# Patient Record
Sex: Female | Born: 1950 | Race: White | Hispanic: No | Marital: Married | Smoking: Never smoker
Health system: Southern US, Community
[De-identification: ages and names within clinical notes are randomized; demographics above are authoritative.]

## PROBLEM LIST (undated history)

## (undated) DIAGNOSIS — F419 Anxiety disorder, unspecified: Secondary | ICD-10-CM

## (undated) DIAGNOSIS — K589 Irritable bowel syndrome without diarrhea: Secondary | ICD-10-CM

## (undated) DIAGNOSIS — G4700 Insomnia, unspecified: Secondary | ICD-10-CM

## (undated) DIAGNOSIS — K219 Gastro-esophageal reflux disease without esophagitis: Secondary | ICD-10-CM

## (undated) DIAGNOSIS — C801 Malignant (primary) neoplasm, unspecified: Secondary | ICD-10-CM

## (undated) DIAGNOSIS — E079 Disorder of thyroid, unspecified: Secondary | ICD-10-CM

## (undated) DIAGNOSIS — E039 Hypothyroidism, unspecified: Secondary | ICD-10-CM

## (undated) HISTORY — PX: TUBAL LIGATION: SHX77

## (undated) HISTORY — PX: KNEE ARTHROSCOPY: SHX127

## (undated) HISTORY — DX: Gastro-esophageal reflux disease without esophagitis: K21.9

## (undated) HISTORY — PX: TONSILLECTOMY: SUR1361

## (undated) HISTORY — PX: APPENDECTOMY: SHX54

## (undated) HISTORY — PX: HERNIA REPAIR: SHX51

---

## 2015-07-18 DIAGNOSIS — J3081 Allergic rhinitis due to animal (cat) (dog) hair and dander: Secondary | ICD-10-CM | POA: Diagnosis not present

## 2015-07-18 DIAGNOSIS — J4520 Mild intermittent asthma, uncomplicated: Secondary | ICD-10-CM | POA: Diagnosis not present

## 2015-09-14 DIAGNOSIS — Z1283 Encounter for screening for malignant neoplasm of skin: Secondary | ICD-10-CM | POA: Diagnosis not present

## 2015-09-14 DIAGNOSIS — L820 Inflamed seborrheic keratosis: Secondary | ICD-10-CM | POA: Diagnosis not present

## 2015-09-14 DIAGNOSIS — Z808 Family history of malignant neoplasm of other organs or systems: Secondary | ICD-10-CM | POA: Diagnosis not present

## 2015-09-14 DIAGNOSIS — D229 Melanocytic nevi, unspecified: Secondary | ICD-10-CM | POA: Diagnosis not present

## 2015-09-26 DIAGNOSIS — E782 Mixed hyperlipidemia: Secondary | ICD-10-CM | POA: Diagnosis not present

## 2015-09-26 DIAGNOSIS — E034 Atrophy of thyroid (acquired): Secondary | ICD-10-CM | POA: Diagnosis not present

## 2015-10-03 DIAGNOSIS — F5102 Adjustment insomnia: Secondary | ICD-10-CM | POA: Diagnosis not present

## 2015-10-03 DIAGNOSIS — E034 Atrophy of thyroid (acquired): Secondary | ICD-10-CM | POA: Diagnosis not present

## 2015-10-03 DIAGNOSIS — K589 Irritable bowel syndrome without diarrhea: Secondary | ICD-10-CM | POA: Diagnosis not present

## 2015-10-03 DIAGNOSIS — E782 Mixed hyperlipidemia: Secondary | ICD-10-CM | POA: Diagnosis not present

## 2015-10-03 DIAGNOSIS — J4520 Mild intermittent asthma, uncomplicated: Secondary | ICD-10-CM | POA: Diagnosis not present

## 2015-11-29 DIAGNOSIS — H9192 Unspecified hearing loss, left ear: Secondary | ICD-10-CM | POA: Diagnosis not present

## 2015-11-29 DIAGNOSIS — R05 Cough: Secondary | ICD-10-CM | POA: Diagnosis not present

## 2015-11-29 DIAGNOSIS — G8911 Acute pain due to trauma: Secondary | ICD-10-CM | POA: Diagnosis not present

## 2015-11-29 DIAGNOSIS — H9202 Otalgia, left ear: Secondary | ICD-10-CM | POA: Diagnosis not present

## 2015-12-04 DIAGNOSIS — H9122 Sudden idiopathic hearing loss, left ear: Secondary | ICD-10-CM | POA: Diagnosis not present

## 2015-12-04 DIAGNOSIS — H9312 Tinnitus, left ear: Secondary | ICD-10-CM | POA: Diagnosis not present

## 2015-12-07 DIAGNOSIS — R233 Spontaneous ecchymoses: Secondary | ICD-10-CM | POA: Diagnosis not present

## 2015-12-07 DIAGNOSIS — R21 Rash and other nonspecific skin eruption: Secondary | ICD-10-CM | POA: Diagnosis not present

## 2015-12-07 DIAGNOSIS — K589 Irritable bowel syndrome without diarrhea: Secondary | ICD-10-CM | POA: Diagnosis not present

## 2015-12-11 DIAGNOSIS — H9122 Sudden idiopathic hearing loss, left ear: Secondary | ICD-10-CM | POA: Diagnosis not present

## 2015-12-22 DIAGNOSIS — K588 Other irritable bowel syndrome: Secondary | ICD-10-CM | POA: Diagnosis not present

## 2016-02-01 DIAGNOSIS — E039 Hypothyroidism, unspecified: Secondary | ICD-10-CM | POA: Diagnosis not present

## 2016-02-01 DIAGNOSIS — K589 Irritable bowel syndrome without diarrhea: Secondary | ICD-10-CM | POA: Diagnosis not present

## 2016-02-01 DIAGNOSIS — G4700 Insomnia, unspecified: Secondary | ICD-10-CM | POA: Diagnosis not present

## 2016-02-01 DIAGNOSIS — Z79899 Other long term (current) drug therapy: Secondary | ICD-10-CM | POA: Diagnosis not present

## 2016-02-29 DIAGNOSIS — Z1389 Encounter for screening for other disorder: Secondary | ICD-10-CM | POA: Diagnosis not present

## 2016-02-29 DIAGNOSIS — Z7189 Other specified counseling: Secondary | ICD-10-CM | POA: Diagnosis not present

## 2016-02-29 DIAGNOSIS — Z0000 Encounter for general adult medical examination without abnormal findings: Secondary | ICD-10-CM | POA: Diagnosis not present

## 2016-02-29 DIAGNOSIS — E039 Hypothyroidism, unspecified: Secondary | ICD-10-CM | POA: Diagnosis not present

## 2016-02-29 DIAGNOSIS — K589 Irritable bowel syndrome without diarrhea: Secondary | ICD-10-CM | POA: Diagnosis not present

## 2016-02-29 DIAGNOSIS — G4700 Insomnia, unspecified: Secondary | ICD-10-CM | POA: Diagnosis not present

## 2016-04-04 DIAGNOSIS — R109 Unspecified abdominal pain: Secondary | ICD-10-CM | POA: Diagnosis not present

## 2016-04-04 DIAGNOSIS — R110 Nausea: Secondary | ICD-10-CM | POA: Diagnosis not present

## 2016-04-15 DIAGNOSIS — N281 Cyst of kidney, acquired: Secondary | ICD-10-CM | POA: Diagnosis not present

## 2016-04-15 DIAGNOSIS — R110 Nausea: Secondary | ICD-10-CM | POA: Diagnosis not present

## 2016-04-15 DIAGNOSIS — K824 Cholesterolosis of gallbladder: Secondary | ICD-10-CM | POA: Diagnosis not present

## 2016-04-22 DIAGNOSIS — R1084 Generalized abdominal pain: Secondary | ICD-10-CM | POA: Diagnosis not present

## 2016-04-22 DIAGNOSIS — R198 Other specified symptoms and signs involving the digestive system and abdomen: Secondary | ICD-10-CM | POA: Diagnosis not present

## 2016-06-03 DIAGNOSIS — M25461 Effusion, right knee: Secondary | ICD-10-CM | POA: Diagnosis not present

## 2016-06-03 DIAGNOSIS — S86911A Strain of unspecified muscle(s) and tendon(s) at lower leg level, right leg, initial encounter: Secondary | ICD-10-CM | POA: Diagnosis not present

## 2016-06-03 DIAGNOSIS — R030 Elevated blood-pressure reading, without diagnosis of hypertension: Secondary | ICD-10-CM | POA: Diagnosis not present

## 2016-06-03 DIAGNOSIS — M25561 Pain in right knee: Secondary | ICD-10-CM | POA: Diagnosis not present

## 2016-06-03 DIAGNOSIS — S86811A Strain of other muscle(s) and tendon(s) at lower leg level, right leg, initial encounter: Secondary | ICD-10-CM | POA: Diagnosis not present

## 2016-06-03 DIAGNOSIS — M25569 Pain in unspecified knee: Secondary | ICD-10-CM | POA: Diagnosis not present

## 2016-06-04 DIAGNOSIS — M7121 Synovial cyst of popliteal space [Baker], right knee: Secondary | ICD-10-CM | POA: Diagnosis not present

## 2016-06-04 DIAGNOSIS — M25561 Pain in right knee: Secondary | ICD-10-CM | POA: Diagnosis not present

## 2016-06-13 DIAGNOSIS — G4700 Insomnia, unspecified: Secondary | ICD-10-CM | POA: Diagnosis not present

## 2016-06-13 DIAGNOSIS — E039 Hypothyroidism, unspecified: Secondary | ICD-10-CM | POA: Diagnosis not present

## 2016-06-13 DIAGNOSIS — K589 Irritable bowel syndrome without diarrhea: Secondary | ICD-10-CM | POA: Diagnosis not present

## 2016-06-13 DIAGNOSIS — Z79899 Other long term (current) drug therapy: Secondary | ICD-10-CM | POA: Diagnosis not present

## 2016-06-13 DIAGNOSIS — Z6827 Body mass index (BMI) 27.0-27.9, adult: Secondary | ICD-10-CM | POA: Diagnosis not present

## 2016-07-02 DIAGNOSIS — M25561 Pain in right knee: Secondary | ICD-10-CM | POA: Diagnosis not present

## 2016-07-02 DIAGNOSIS — M7121 Synovial cyst of popliteal space [Baker], right knee: Secondary | ICD-10-CM | POA: Diagnosis not present

## 2016-07-03 DIAGNOSIS — K589 Irritable bowel syndrome without diarrhea: Secondary | ICD-10-CM | POA: Diagnosis not present

## 2016-07-03 DIAGNOSIS — K5732 Diverticulitis of large intestine without perforation or abscess without bleeding: Secondary | ICD-10-CM | POA: Diagnosis not present

## 2016-07-03 DIAGNOSIS — K5792 Diverticulitis of intestine, part unspecified, without perforation or abscess without bleeding: Secondary | ICD-10-CM | POA: Diagnosis not present

## 2016-07-03 DIAGNOSIS — R1032 Left lower quadrant pain: Secondary | ICD-10-CM | POA: Diagnosis not present

## 2016-07-03 DIAGNOSIS — N390 Urinary tract infection, site not specified: Secondary | ICD-10-CM | POA: Diagnosis not present

## 2016-07-05 DIAGNOSIS — R3989 Other symptoms and signs involving the genitourinary system: Secondary | ICD-10-CM | POA: Diagnosis not present

## 2016-07-05 DIAGNOSIS — R319 Hematuria, unspecified: Secondary | ICD-10-CM | POA: Diagnosis not present

## 2016-07-05 DIAGNOSIS — R1032 Left lower quadrant pain: Secondary | ICD-10-CM | POA: Diagnosis not present

## 2016-07-05 DIAGNOSIS — T368X5A Adverse effect of other systemic antibiotics, initial encounter: Secondary | ICD-10-CM | POA: Diagnosis not present

## 2016-07-05 DIAGNOSIS — R8299 Other abnormal findings in urine: Secondary | ICD-10-CM | POA: Diagnosis not present

## 2016-07-05 DIAGNOSIS — Z8719 Personal history of other diseases of the digestive system: Secondary | ICD-10-CM | POA: Diagnosis not present

## 2016-07-05 DIAGNOSIS — R110 Nausea: Secondary | ICD-10-CM | POA: Diagnosis not present

## 2016-07-05 DIAGNOSIS — T373X5A Adverse effect of other antiprotozoal drugs, initial encounter: Secondary | ICD-10-CM | POA: Diagnosis not present

## 2016-08-06 DIAGNOSIS — M25561 Pain in right knee: Secondary | ICD-10-CM | POA: Diagnosis not present

## 2016-08-20 DIAGNOSIS — M23021 Cystic meniscus, posterior horn of medial meniscus, right knee: Secondary | ICD-10-CM | POA: Diagnosis not present

## 2016-08-20 DIAGNOSIS — M1711 Unilateral primary osteoarthritis, right knee: Secondary | ICD-10-CM | POA: Diagnosis not present

## 2016-08-20 DIAGNOSIS — M25561 Pain in right knee: Secondary | ICD-10-CM | POA: Diagnosis not present

## 2016-08-22 DIAGNOSIS — M25561 Pain in right knee: Secondary | ICD-10-CM | POA: Diagnosis not present

## 2016-08-28 DIAGNOSIS — S83281A Other tear of lateral meniscus, current injury, right knee, initial encounter: Secondary | ICD-10-CM | POA: Diagnosis not present

## 2016-08-28 DIAGNOSIS — M23251 Derangement of posterior horn of lateral meniscus due to old tear or injury, right knee: Secondary | ICD-10-CM | POA: Diagnosis not present

## 2016-08-28 DIAGNOSIS — S83241A Other tear of medial meniscus, current injury, right knee, initial encounter: Secondary | ICD-10-CM | POA: Diagnosis not present

## 2016-08-28 DIAGNOSIS — M23211 Derangement of anterior horn of medial meniscus due to old tear or injury, right knee: Secondary | ICD-10-CM | POA: Diagnosis not present

## 2016-09-12 DIAGNOSIS — G4700 Insomnia, unspecified: Secondary | ICD-10-CM | POA: Diagnosis not present

## 2016-09-12 DIAGNOSIS — Z6827 Body mass index (BMI) 27.0-27.9, adult: Secondary | ICD-10-CM | POA: Diagnosis not present

## 2016-09-12 DIAGNOSIS — F419 Anxiety disorder, unspecified: Secondary | ICD-10-CM | POA: Diagnosis not present

## 2016-10-10 DIAGNOSIS — Z1231 Encounter for screening mammogram for malignant neoplasm of breast: Secondary | ICD-10-CM | POA: Diagnosis not present

## 2016-10-10 DIAGNOSIS — N6312 Unspecified lump in the right breast, upper inner quadrant: Secondary | ICD-10-CM | POA: Diagnosis not present

## 2016-10-10 DIAGNOSIS — F419 Anxiety disorder, unspecified: Secondary | ICD-10-CM | POA: Diagnosis not present

## 2016-10-10 DIAGNOSIS — Z803 Family history of malignant neoplasm of breast: Secondary | ICD-10-CM | POA: Diagnosis not present

## 2016-10-10 DIAGNOSIS — R921 Mammographic calcification found on diagnostic imaging of breast: Secondary | ICD-10-CM | POA: Diagnosis not present

## 2016-10-10 DIAGNOSIS — N6341 Unspecified lump in right breast, subareolar: Secondary | ICD-10-CM | POA: Diagnosis not present

## 2016-10-14 DIAGNOSIS — K589 Irritable bowel syndrome without diarrhea: Secondary | ICD-10-CM | POA: Diagnosis not present

## 2016-10-18 DIAGNOSIS — N6312 Unspecified lump in the right breast, upper inner quadrant: Secondary | ICD-10-CM | POA: Diagnosis not present

## 2016-10-18 DIAGNOSIS — Z803 Family history of malignant neoplasm of breast: Secondary | ICD-10-CM | POA: Diagnosis not present

## 2016-10-22 DIAGNOSIS — N6312 Unspecified lump in the right breast, upper inner quadrant: Secondary | ICD-10-CM | POA: Diagnosis not present

## 2016-10-22 DIAGNOSIS — N6310 Unspecified lump in the right breast, unspecified quadrant: Secondary | ICD-10-CM | POA: Diagnosis not present

## 2016-10-22 DIAGNOSIS — D241 Benign neoplasm of right breast: Secondary | ICD-10-CM | POA: Diagnosis not present

## 2016-12-11 DIAGNOSIS — Z6826 Body mass index (BMI) 26.0-26.9, adult: Secondary | ICD-10-CM | POA: Diagnosis not present

## 2016-12-11 DIAGNOSIS — G4700 Insomnia, unspecified: Secondary | ICD-10-CM | POA: Diagnosis not present

## 2016-12-11 DIAGNOSIS — Z0000 Encounter for general adult medical examination without abnormal findings: Secondary | ICD-10-CM | POA: Diagnosis not present

## 2016-12-11 DIAGNOSIS — K589 Irritable bowel syndrome without diarrhea: Secondary | ICD-10-CM | POA: Diagnosis not present

## 2016-12-11 DIAGNOSIS — F419 Anxiety disorder, unspecified: Secondary | ICD-10-CM | POA: Diagnosis not present

## 2016-12-11 DIAGNOSIS — R9431 Abnormal electrocardiogram [ECG] [EKG]: Secondary | ICD-10-CM | POA: Diagnosis not present

## 2017-03-06 ENCOUNTER — Encounter: Payer: Self-pay | Admitting: Emergency Medicine

## 2017-03-06 ENCOUNTER — Ambulatory Visit
Admission: EM | Admit: 2017-03-06 | Discharge: 2017-03-06 | Disposition: A | Discharge status: Home / Self Care | Payer: Medicare Other | Attending: Family Medicine | Admitting: Family Medicine

## 2017-03-06 DIAGNOSIS — R1012 Left upper quadrant pain: Secondary | ICD-10-CM | POA: Insufficient documentation

## 2017-03-06 DIAGNOSIS — G8929 Other chronic pain: Secondary | ICD-10-CM

## 2017-03-06 DIAGNOSIS — R109 Unspecified abdominal pain: Secondary | ICD-10-CM | POA: Diagnosis present

## 2017-03-06 DIAGNOSIS — R1013 Epigastric pain: Secondary | ICD-10-CM | POA: Diagnosis not present

## 2017-03-06 HISTORY — DX: Insomnia, unspecified: G47.00

## 2017-03-06 HISTORY — DX: Hypothyroidism, unspecified: E03.9

## 2017-03-06 HISTORY — DX: Irritable bowel syndrome without diarrhea: K58.9

## 2017-03-06 HISTORY — DX: Anxiety disorder, unspecified: F41.9

## 2017-03-06 LAB — COMPREHENSIVE METABOLIC PANEL
ALBUMIN: 4.3 g/dL (ref 3.5–5.0)
ALK PHOS: 74 U/L (ref 38–126)
ALT: 17 U/L (ref 14–54)
AST: 25 U/L (ref 15–41)
Anion gap: 8 (ref 5–15)
BILIRUBIN TOTAL: 0.6 mg/dL (ref 0.3–1.2)
BUN: 17 mg/dL (ref 6–20)
CALCIUM: 9.5 mg/dL (ref 8.9–10.3)
CO2: 30 mmol/L (ref 22–32)
CREATININE: 0.98 mg/dL (ref 0.44–1.00)
Chloride: 102 mmol/L (ref 101–111)
GFR calc Af Amer: >60 mL/min (ref >60)
GFR calc non Af Amer: 58 mL/min — ABNORMAL LOW (ref >60)
GLUCOSE: 143 mg/dL — AB (ref 65–99)
Potassium: 3.7 mmol/L (ref 3.5–5.1)
SODIUM: 140 mmol/L (ref 135–145)
TOTAL PROTEIN: 7.4 g/dL (ref 6.5–8.1)

## 2017-03-06 LAB — CBC WITH DIFFERENTIAL/PLATELET
BASOS ABS: 0 10*3/uL (ref 0–0.1)
BASOS PCT: 1 %
EOS PCT: 7 %
Eosinophils Absolute: 0.2 10*3/uL (ref 0–0.7)
HEMATOCRIT: 41.4 % (ref 35.0–47.0)
Hemoglobin: 13.9 g/dL (ref 12.0–16.0)
Lymphocytes Relative: 29 %
Lymphs Abs: 1.1 10*3/uL (ref 1.0–3.6)
MCH: 30.8 pg (ref 26.0–34.0)
MCHC: 33.5 g/dL (ref 32.0–36.0)
MCV: 92 fL (ref 80.0–100.0)
MONO ABS: 0.2 10*3/uL (ref 0.2–0.9)
MONOS PCT: 6 %
Neutro Abs: 2.1 10*3/uL (ref 1.4–6.5)
Neutrophils Relative %: 57 %
PLATELETS: 218 10*3/uL (ref 150–440)
RBC: 4.5 MIL/uL (ref 3.80–5.20)
RDW: 13.3 % (ref 11.5–14.5)
WBC: 3.7 10*3/uL (ref 3.6–11.0)

## 2017-03-06 LAB — URINALYSIS, COMPLETE (UACMP) WITH MICROSCOPIC
BACTERIA UA: NONE SEEN
BILIRUBIN URINE: NEGATIVE
Glucose, UA: NEGATIVE mg/dL
Ketones, ur: NEGATIVE mg/dL
LEUKOCYTES UA: NEGATIVE
Nitrite: NEGATIVE
PH: 7 (ref 5.0–8.0)
Protein, ur: NEGATIVE mg/dL
SPECIFIC GRAVITY, URINE: 1.02 (ref 1.005–1.030)
WBC, UA: NONE SEEN WBC/hpf (ref 0–5)

## 2017-03-06 LAB — LIPASE, BLOOD: Lipase: 41 U/L (ref 11–51)

## 2017-03-06 MED ORDER — PANTOPRAZOLE SODIUM 40 MG PO TBEC: 40.0000 mg | DELAYED_RELEASE_TABLET | Freq: Every day | ORAL | 1 refills | Status: DC

## 2017-03-06 NOTE — Discharge Instructions (Signed)
Labs normal except for elevated glucose. Please have your primary check A1c.  Take the Protonix daily.  Follow up closely with your primary.  Take care  Dr. Lacinda Axon

## 2017-03-06 NOTE — ED Provider Notes (Signed)
[facility]    CSN: [id number] Arrival date & time: 03/06/17  [place]  History   Chief Complaint Chief Complaint  Patient presents with  . Abdominal Pain   HPI  66 year old female with IBS presents with ongoing abdominal pain.  Patient reports that 8 month history of intermittent abdominal pain. Located in the epigastric and left upper quadrant. Described as a pressure/burning sensation. She also reports gas and bloating. Worse after she eats and tends to occur fairly quickly after she eats. No known relieving factors. No associated fever or chills. Sometimes has nausea. No vomiting.. No reports of weight loss. She states that she has a history of diverticulitis but states that this is different. She has seen gastroenterology in [place] but no cause is been identified. She is currently in 5/10 pain. Her pain started this evening after eating.  Past Medical History:  Diagnosis Date  . Anxiety   . Hypothyroidism   . IBS (irritable bowel syndrome)   . Insomnia    Past Surgical History:  Procedure Laterality Date  . APPENDECTOMY    . KNEE ARTHROSCOPY    . TUBAL LIGATION     Home Medications    Prior to Admission medications   Medication Sig Start Date End Date Taking? Authorizing Provider  amitriptyline (ELAVIL) 25 MG tablet Take 25 mg by mouth at bedtime.   Yes [provider]  levothyroxine (SYNTHROID, LEVOTHROID) 50 MCG tablet Take 50 mcg by mouth daily before breakfast.   Yes [provider]  traZODone (DESYREL) 50 MG tablet Take 50 mg by mouth 2 (two) times daily.   Yes [provider]  pantoprazole (PROTONIX) 40 MG tablet Take 1 tablet (40 mg total) by mouth daily. 03/06/17   Coral Spikes, DO   Family History Family History  Problem Relation Age of Onset  . Cancer Mother   . Cancer Sister    Social History Social History  Substance Use Topics  . Smoking status: Never Smoker  . Smokeless tobacco: Never Used  . Alcohol use Yes      Allergies   Codeine   Review of Systems Review of Systems  Constitutional: Negative for chills and fever.  Gastrointestinal: Positive for abdominal pain, diarrhea and nausea. Negative for vomiting.  All other systems reviewed and are negative.  Physical Exam Triage Vital Signs ED Triage Vitals  Enc Vitals Group     BP 03/06/17 1916 133/84     Pulse Rate 03/06/17 1916 82     Resp 03/06/17 1916 16     Temp 03/06/17 1916 98.2 F (36.8 C)     Temp Source 03/06/17 1916 Oral     SpO2 03/06/17 1916 100 %     Weight 03/06/17 1917 134 lb (60.8 kg)     Height 03/06/17 1917 5\' 2"  (1.575 m)     Head Circumference --      Peak Flow --      Pain Score 03/06/17 1917 7     Pain Loc --      Pain Edu? --      Excl. in [place]? --    Updated Vital Signs BP 133/84 (BP Location: Left Arm)   Pulse 82   Temp 98.2 F (36.8 C) (Oral)   Resp 16   Ht 5\' 2"  (1.575 m)   Wt 134 lb (60.8 kg)   SpO2 100%   BMI 24.51 kg/m      Physical Exam  Constitutional: She is oriented to person, place,  and time. She appears well-developed. No distress.  HENT:  Head: Normocephalic and atraumatic.  Nose: Nose normal.  Mouth/Throat: Oropharynx is clear and moist.  Eyes: Conjunctivae are normal. No scleral icterus.  Neck: Normal range of motion. No tracheal deviation present. No thyromegaly present.  Cardiovascular: Normal rate, regular rhythm and normal heart sounds.   Pulmonary/Chest: Effort normal and breath sounds normal. No respiratory distress. She has no wheezes. She has no rales.  Abdominal: Soft. She exhibits no distension.  Tender to palpation in the epigastric region and left upper quadrant. No rebound or guarding.  Musculoskeletal: Normal range of motion.  No CVA tenderness.  Neurological: She is alert and oriented to person, place, and time.  Normal tone. No apparent deficits.  Skin: Skin is warm. No rash noted.  Psychiatric:  Flat affect. Depressed mood.  Vitals reviewed.  UC Treatments  / Results  Labs (all labs ordered are listed, but only abnormal results are displayed) Labs Reviewed  URINALYSIS, COMPLETE (UACMP) WITH MICROSCOPIC - Abnormal; Notable for the following:       Result Value   Hgb urine dipstick TRACE (*)    Squamous Epithelial / LPF 0-5 (*)    All other components within normal limits  COMPREHENSIVE METABOLIC PANEL - Abnormal; Notable for the following:    Glucose, Bld 143 (*)    GFR calc non Af Amer 58 (*)    All other components within normal limits  CBC WITH DIFFERENTIAL/PLATELET  LIPASE, BLOOD    EKG  EKG Interpretation None      Radiology No results found.  Procedures Procedures (including critical care time)  Medications Ordered in UC Medications - No data to display   Initial Impression / Assessment and Plan / UC Course  I have reviewed the triage vital signs and the nursing notes.  Pertinent labs & imaging results that were available during my care of the patient were reviewed by me and considered in my medical decision making (see chart for details).   66 year old female presents with chronic epigastric/left upper quadrant pain. Well-appearing. No acute distress. Labs revealed elevated glucose but otherwise unremarkable. Does not appear to have UTI. No reports of urinary symptoms. Patient likely has gastritis/peptic ulcer disease. Treating with Protonix. Advised her to follow-up with her new primary. If fails to improve we'll need referral to GI and endoscopy.  Final Clinical Impressions(s) / UC Diagnoses   Final diagnoses:  Abdominal pain, chronic, epigastric  LUQ pain   New Prescriptions Discharge Medication List as of 03/06/2017  8:05 PM    START taking these medications   Details  pantoprazole (PROTONIX) 40 MG tablet Take 1 tablet (40 mg total) by mouth daily., Starting Thu 03/06/2017, Normal       Controlled Substance Prescriptions [place] Controlled Substance Registry consulted? Not Applicable   Coral Spikes,  DO 03/06/17 2016

## 2017-03-06 NOTE — ED Triage Notes (Signed)
Patient reports having upper abdominal pain after she eats.  Patient states that this symptom has been going on off and on for months.  Patient thinks she might have Diverticulosis. Patient reports pain started around 5pm today.

## 2017-03-07 ENCOUNTER — Telehealth: Payer: Self-pay | Admitting: Gastroenterology

## 2017-03-07 NOTE — Telephone Encounter (Signed)
Attempted to contact patient to schedule ED follow up for abdominal pain, epigastric pain. No voicemail.

## 2017-03-10 ENCOUNTER — Other Ambulatory Visit: Payer: Self-pay

## 2017-03-10 ENCOUNTER — Ambulatory Visit: Payer: Medicare Other | Admitting: Gastroenterology

## 2017-03-20 DIAGNOSIS — I493 Ventricular premature depolarization: Secondary | ICD-10-CM | POA: Insufficient documentation

## 2017-03-20 DIAGNOSIS — K219 Gastro-esophageal reflux disease without esophagitis: Secondary | ICD-10-CM | POA: Insufficient documentation

## 2017-03-20 DIAGNOSIS — E782 Mixed hyperlipidemia: Secondary | ICD-10-CM | POA: Insufficient documentation

## 2017-03-24 DIAGNOSIS — G4709 Other insomnia: Secondary | ICD-10-CM | POA: Diagnosis not present

## 2017-03-24 DIAGNOSIS — R29890 Loss of height: Secondary | ICD-10-CM | POA: Diagnosis not present

## 2017-03-24 DIAGNOSIS — K580 Irritable bowel syndrome with diarrhea: Secondary | ICD-10-CM | POA: Diagnosis not present

## 2017-03-24 DIAGNOSIS — M899 Disorder of bone, unspecified: Secondary | ICD-10-CM | POA: Diagnosis not present

## 2017-03-24 DIAGNOSIS — K219 Gastro-esophageal reflux disease without esophagitis: Secondary | ICD-10-CM | POA: Diagnosis not present

## 2017-03-24 DIAGNOSIS — F411 Generalized anxiety disorder: Secondary | ICD-10-CM | POA: Diagnosis not present

## 2017-03-24 DIAGNOSIS — E039 Hypothyroidism, unspecified: Secondary | ICD-10-CM | POA: Diagnosis not present

## 2017-03-24 DIAGNOSIS — Z23 Encounter for immunization: Secondary | ICD-10-CM | POA: Diagnosis not present

## 2017-03-27 ENCOUNTER — Other Ambulatory Visit: Payer: Self-pay | Admitting: Pediatrics

## 2017-03-27 DIAGNOSIS — R29890 Loss of height: Secondary | ICD-10-CM

## 2017-03-27 DIAGNOSIS — K580 Irritable bowel syndrome with diarrhea: Secondary | ICD-10-CM

## 2017-03-27 DIAGNOSIS — E039 Hypothyroidism, unspecified: Secondary | ICD-10-CM

## 2017-03-27 DIAGNOSIS — I493 Ventricular premature depolarization: Secondary | ICD-10-CM | POA: Diagnosis not present

## 2017-03-27 DIAGNOSIS — M899 Disorder of bone, unspecified: Secondary | ICD-10-CM

## 2017-04-03 ENCOUNTER — Ambulatory Visit
Admission: EM | Admit: 2017-04-03 | Discharge: 2017-04-03 | Disposition: A | Discharge status: Home / Self Care | Payer: Medicare Other | Attending: Family Medicine | Admitting: Family Medicine

## 2017-04-03 ENCOUNTER — Encounter: Payer: Self-pay | Admitting: *Deleted

## 2017-04-03 DIAGNOSIS — R1013 Epigastric pain: Secondary | ICD-10-CM

## 2017-04-03 DIAGNOSIS — G8929 Other chronic pain: Secondary | ICD-10-CM

## 2017-04-03 MED ORDER — PANTOPRAZOLE SODIUM 40 MG PO TBEC: 40.0000 mg | DELAYED_RELEASE_TABLET | Freq: Two times a day (BID) | ORAL | 1 refills | Status: DC

## 2017-04-03 NOTE — Discharge Instructions (Signed)
Increase the protonix to 2 times daily before a meal.  Be sure to see Dr. Allen Norris.  Take care  Dr. Lacinda Axon

## 2017-04-03 NOTE — ED Triage Notes (Signed)
C/O of abd pain. No other symptoms. Pt states she was seen her about a month ago

## 2017-04-03 NOTE — ED Provider Notes (Signed)
[facility]    CSN: [id number] Arrival date & time: 04/03/17  1756     History   Chief Complaint Chief Complaint  Patient presents with  . Abdominal Pain   HPI  66 year old female presents with abdominal pain.  I recently saw her for this on 10/11.  She has had chronic epigastric/left upper quadrant pain for months.  I put her on Protonix and she states that she has been doing well.  Yesterday however, she had worsening in her pain.  She is unsure of the cause.  She states no different foods or known inciting factors.  She states that she has taken half of a Protonix today and has felt better today.  Pain is currently 6 out of 10 in severity.  She typically takes her Protonix at night.  I told her previously that she needed an endoscopy and her primary seems to support this.  She has an upcoming appointment with GI.  No other reported symptoms.  She states that everything is the same.  She still has gas and bloating and burning pain in the epigastric/left upper quadrant.  Past Medical History:  Diagnosis Date  . Anxiety   . Hypothyroidism   . IBS (irritable bowel syndrome)   . Insomnia     Past Surgical History:  Procedure Laterality Date  . APPENDECTOMY    . KNEE ARTHROSCOPY    . TUBAL LIGATION      OB History    No data available     Home Medications    Prior to Admission medications   Medication Sig Start Date End Date Taking? Authorizing Provider  amitriptyline (ELAVIL) 25 MG tablet Take 25 mg by mouth at bedtime.   Yes [provider]  FLUoxetine (PROZAC) 10 MG tablet  03/07/17  Yes [provider]  levothyroxine (SYNTHROID, LEVOTHROID) 50 MCG tablet Take 50 mcg by mouth daily before breakfast.   Yes [provider]  traZODone (DESYREL) 50 MG tablet Take 50 mg by mouth 2 (two) times daily.   Yes [provider]  pantoprazole (PROTONIX) 40 MG tablet Take 1 tablet (40 mg total) 2 (two) times daily before a meal by  mouth. 04/03/17   Coral Spikes, DO    Family History Family History  Problem Relation Age of Onset  . Cancer Mother   . Cancer Sister    Social History Social History   Tobacco Use  . Smoking status: Never Smoker  . Smokeless tobacco: Never Used  Substance Use Topics  . Alcohol use: Yes  . Drug use: No   Allergies   Codeine   Review of Systems Review of Systems  Constitutional: Negative.   Gastrointestinal: Positive for abdominal pain. Negative for vomiting.   Physical Exam Triage Vital Signs ED Triage Vitals  Enc Vitals Group     BP 04/03/17 1805 130/75     Pulse Rate 04/03/17 1805 79     Resp 04/03/17 1805 16     Temp 04/03/17 1805 97.9 F (36.6 C)     Temp Source 04/03/17 1805 Oral     SpO2 04/03/17 1805 100 %     Weight 04/03/17 1806 134 lb (60.8 kg)     Height 04/03/17 1806 5\' 2"  (1.575 m)     Head Circumference --      Peak Flow --      Pain Score 04/03/17 1806 6     Pain Loc --      Pain Edu? --  Excl. in [place]? --     Updated Vital Signs BP 130/75 (BP Location: Left Arm)   Pulse 79   Temp 97.9 F (36.6 C) (Oral)   Resp 16   Ht 5\' 2"  (1.575 m)   Wt 134 lb (60.8 kg)   SpO2 100%   BMI 24.51 kg/m   Physical Exam  Constitutional: She is oriented to person, place, and time. She appears well-developed and well-nourished. No distress.  HENT:  Head: Normocephalic and atraumatic.  Nose: Nose normal.  Eyes: Conjunctivae are normal. No scleral icterus.  Pulmonary/Chest: Effort normal. No respiratory distress.  Abdominal: Soft.  Exquisitely tender in the epigastric region.  No rebound or guarding.  Neurological: She is alert and oriented to person, place, and time.  No apparent focal neurologic deficits.  Skin: Skin is warm. No rash noted.  Psychiatric: She has a normal mood and affect. Her behavior is normal.  Vitals reviewed.  UC Treatments / Results  Labs (all labs ordered are listed, but only abnormal results are displayed) Labs Reviewed  - No data to display  EKG  EKG Interpretation None       Radiology No results found.  Procedures Procedures (including critical care time)  Medications Ordered in UC Medications - No data to display   Initial Impression / Assessment and Plan / UC Course  I have reviewed the triage vital signs and the nursing notes.  Pertinent labs & imaging results that were available during my care of the patient were reviewed by me and considered in my medical decision making (see chart for details).     66 year old female presents with recent worsening of chronic epigastric abdominal pain.  Advised to increase her Protonix to twice daily.  Seeing GI soon.  Needs endoscopy.  Final Clinical Impressions(s) / UC Diagnoses   Final diagnoses:  Abdominal pain, chronic, epigastric    ED Discharge Orders        Ordered    pantoprazole (PROTONIX) 40 MG tablet  2 times daily before meals     04/03/17 1818     Controlled Substance Prescriptions [place] Controlled Substance Registry consulted? Not Applicable   Coral Spikes, DO 04/03/17 5427

## 2017-04-16 ENCOUNTER — Other Ambulatory Visit: Payer: Self-pay

## 2017-04-16 ENCOUNTER — Ambulatory Visit (INDEPENDENT_AMBULATORY_CARE_PROVIDER_SITE_OTHER): Payer: Medicare Other | Admitting: Gastroenterology

## 2017-04-16 ENCOUNTER — Encounter (INDEPENDENT_AMBULATORY_CARE_PROVIDER_SITE_OTHER): Payer: Self-pay

## 2017-04-16 ENCOUNTER — Encounter: Payer: Self-pay | Admitting: Gastroenterology

## 2017-04-16 VITALS — BP 119/59 | HR 84 | Ht 62.0 in | Wt 135.0 lb

## 2017-04-16 DIAGNOSIS — R1011 Right upper quadrant pain: Secondary | ICD-10-CM | POA: Diagnosis not present

## 2017-04-16 DIAGNOSIS — R1013 Epigastric pain: Secondary | ICD-10-CM | POA: Diagnosis not present

## 2017-04-16 DIAGNOSIS — G8929 Other chronic pain: Secondary | ICD-10-CM

## 2017-04-16 MED ORDER — PANTOPRAZOLE SODIUM 40 MG PO TBEC: 40.0000 mg | DELAYED_RELEASE_TABLET | Freq: Two times a day (BID) | ORAL | 6 refills | Status: DC

## 2017-04-16 NOTE — Patient Instructions (Addendum)
You are scheduled for a RUQ abdominal US and HIDA on Monday, November 26th @8 :30 am at [facility]. Please check in at the Medical mall registration desk. You cannot have anything to eat or drink after midnight on Sunday night.

## 2017-04-16 NOTE — Progress Notes (Signed)
Gastroenterology Consultation  Referring Provider:     Dr. Lacinda Axon Primary Care Physician:  Valera Castle, MD Primary Gastroenterologist:  Dr. Allen Norris     Reason for Consultation:     Abdominal pain        HPI:   Lacey Ramsey is a 66 y.o. y/o female referred for consultation & management of Abdominal pain by Dr. Kym Groom, Guy Begin, MD.  This patient comes in today after being in the urgent care twice in the last 2 months.  The patient had been in the urgent care clinic in October and November for the same issue.  The patient states that she will have severe abdominal pain in the mid epigastric area that occurs after certain foods.  She states that she was seen last year by a gastroenterologist to had done an ultrasound on her gallbladder and states that there is nothing or with her gallbladder although she denies going through a HIDA scan for gallbladder function.  The patient was started on a PPI in October by Dr. Lacinda Axon and then when she returns in November she was doubled up on her medication and states that she has not had any symptoms since the doubling of her medication.  There is no report of any unexplained weight loss fevers chills nausea vomiting.  The patient also reports that she is in good health typically and works out 5 days a week.  There is no report of any black stools or bloody stools in the patient states she is up-to-date with her colonoscopies.  The patient reports that the abdominal pain is a burning pain in nature.  Past Medical History:  Diagnosis Date  . Anxiety   . Hypothyroidism   . IBS (irritable bowel syndrome)   . Insomnia     Past Surgical History:  Procedure Laterality Date  . APPENDECTOMY    . KNEE ARTHROSCOPY    . TUBAL LIGATION      Prior to Admission medications   Medication Sig Start Date End Date Taking? Authorizing Provider  amitriptyline (ELAVIL) 25 MG tablet Take 25 mg by mouth at bedtime.   Yes [provider]  FLUoxetine  (PROZAC) 10 MG tablet  03/07/17  Yes [provider]  levothyroxine (SYNTHROID, LEVOTHROID) 50 MCG tablet Take 50 mcg by mouth daily before breakfast.   Yes [provider]  pantoprazole (PROTONIX) 40 MG tablet Take 1 tablet (40 mg total) by mouth 2 (two) times daily before a meal. 04/16/17  Yes Lucilla Lame, MD  traZODone (DESYREL) 50 MG tablet Take 50 mg by mouth 2 (two) times daily.   Yes [provider]    Family History  Problem Relation Age of Onset  . Cancer Mother   . Cancer Sister      Social History   Tobacco Use  . Smoking status: Never Smoker  . Smokeless tobacco: Never Used  Substance Use Topics  . Alcohol use: Yes  . Drug use: No    Allergies as of 04/16/2017 - Review Complete 04/16/2017  Allergen Reaction Noted  . Codeine Nausea And Vomiting 03/06/2017    Review of Systems:    All systems reviewed and negative except where noted in HPI.   Physical Exam:  BP (!) 119/59 (BP Location: Left Arm, Patient Position: Sitting, Cuff Size: Normal)   Pulse 84   Ht 5\' 2"  (1.575 m)   Wt 135 lb (61.2 kg)   BMI 24.69 kg/m  No LMP recorded. Patient is postmenopausal. Psych:  Alert and cooperative. Normal mood and affect. General:   Alert,  Well-developed, well-nourished, pleasant and cooperative in NAD Head:  Normocephalic and atraumatic. Eyes:  Sclera clear, no icterus.   Conjunctiva pink. Ears:  Normal auditory acuity. Nose:  No deformity, discharge, or lesions. Mouth:  No deformity or lesions,oropharynx pink & moist. Neck:  Supple; no masses or thyromegaly. Lungs:  Respirations even and unlabored.  Clear throughout to auscultation.   No wheezes, crackles, or rhonchi. No acute distress. Heart:  Regular rate and rhythm; no murmurs, clicks, rubs, or gallops. Abdomen:  Normal bowel sounds.  No bruits.  Soft, non-tender and non-distended without masses, hepatosplenomegaly or hernias noted.  No guarding or rebound tenderness.  Negative Carnett  sign.   Rectal:  Deferred.  Msk:  Symmetrical without gross deformities.  Good, equal movement & strength bilaterally. Pulses:  Normal pulses noted. Extremities:  No clubbing or edema.  No cyanosis. Neurologic:  Alert and oriented x3;  grossly normal neurologically. Skin:  Intact without significant lesions or rashes.  No jaundice. Lymph Nodes:  No significant cervical adenopathy. Psych:  Alert and cooperative. Normal mood and affect.  Imaging Studies: No results found.  Assessment and Plan:   [name] is a 66 y.o. y/o female who comes in today with a history of abdominal pain in the epigastric area that radiates to the left side.  The patient states it is worse with eating.  The patient will be set up for a HIDA scan with a gallbladder ejection fraction.  She will also be set up for an EGD.  The differential diagnosis includes gastritis peptic ulcer disease versus gallbladder disease.  The patient will continue her PPI.  Lucilla Lame, MD. Marval Regal   Note: This dictation was prepared with Dragon dictation along with smaller phrase technology. Any transcriptional errors that result from this process are unintentional.

## 2017-04-21 ENCOUNTER — Encounter
Admission: RE | Admit: 2017-04-21 | Discharge: 2017-04-21 | Disposition: A | Discharge status: Home / Self Care | Payer: Medicare Other | Source: Ambulatory Visit | Attending: Gastroenterology | Admitting: Gastroenterology

## 2017-04-21 DIAGNOSIS — R1011 Right upper quadrant pain: Secondary | ICD-10-CM | POA: Diagnosis not present

## 2017-04-21 DIAGNOSIS — K824 Cholesterolosis of gallbladder: Secondary | ICD-10-CM | POA: Diagnosis not present

## 2017-04-21 MED ORDER — TECHNETIUM TC 99M MEBROFENIN IV KIT
5.4200 | PACK | Freq: Once | INTRAVENOUS | Status: AC | PRN
  Administered 2017-04-21: 5.42 via INTRAVENOUS

## 2017-04-22 ENCOUNTER — Telehealth: Payer: Self-pay

## 2017-04-22 NOTE — Telephone Encounter (Signed)
Pt scheduled for a follow up appt to discuss [place] results on Wednesday, 04/23/17.

## 2017-04-22 NOTE — Telephone Encounter (Signed)
-----   Message from Lucilla Lame, MD sent at 04/21/2017 11:58 AM EST ----- This patient needs to come in to review the ultrasound and will then need a surgical consultation for her gallbladder mass.

## 2017-04-23 ENCOUNTER — Ambulatory Visit (INDEPENDENT_AMBULATORY_CARE_PROVIDER_SITE_OTHER): Payer: Medicare Other | Admitting: Gastroenterology

## 2017-04-23 ENCOUNTER — Encounter: Payer: Self-pay | Admitting: Gastroenterology

## 2017-04-23 ENCOUNTER — Other Ambulatory Visit: Payer: Self-pay

## 2017-04-23 VITALS — BP 123/67 | HR 88 | Ht 62.0 in | Wt 134.0 lb

## 2017-04-23 DIAGNOSIS — K828 Other specified diseases of gallbladder: Secondary | ICD-10-CM

## 2017-04-23 NOTE — Progress Notes (Signed)
Primary Care Physician: [facility]  Primary Gastroenterologist:  Dr. Lucilla Lame  Chief Complaint  Patient presents with  . Follow up US results    HPI: Lacey Ramsey is a 66 y.o. female here for follow-up of her abdominal pain and ultrasound findings. The patient was found to have a 14 mm mass in the gallbladder. The patient states she had a bout of her abdominal pain on the left side that went to her back during Thanksgiving. She states she wasn't eating very much and the pain lasted for hours.  Current Outpatient Medications  Medication Sig Dispense Refill  . amitriptyline (ELAVIL) 25 MG tablet Take 25 mg by mouth at bedtime.    Marland Kitchen FLUoxetine (PROZAC) 10 MG tablet     . levothyroxine (SYNTHROID, LEVOTHROID) 50 MCG tablet Take 50 mcg by mouth daily before breakfast.    . pantoprazole (PROTONIX) 40 MG tablet Take 1 tablet (40 mg total) by mouth 2 (two) times daily before a meal. 60 tablet 6  . traZODone (DESYREL) 50 MG tablet Take 50 mg by mouth 2 (two) times daily.     No current facility-administered medications for this visit.     Allergies as of 04/23/2017 - Review Complete 04/16/2017  Allergen Reaction Noted  . Codeine Nausea And Vomiting 03/06/2017    ROS:  General: Negative for anorexia, weight loss, fever, chills, fatigue, weakness. ENT: Negative for hoarseness, difficulty swallowing , nasal congestion. CV: Negative for chest pain, angina, palpitations, dyspnea on exertion, peripheral edema.  Respiratory: Negative for dyspnea at rest, dyspnea on exertion, cough, sputum, wheezing.  GI: See history of present illness. GU:  Negative for dysuria, hematuria, urinary incontinence, urinary frequency, nocturnal urination.  Endo: Negative for unusual weight change.    Physical Examination:   BP 123/67 (BP Location: Left Arm, Patient Position: Sitting, Cuff Size: Normal)   Pulse 88   Ht 5\' 2"  (1.575 m)   Wt 134 lb (60.8 kg)   BMI 24.51 kg/m    General: Well-nourished, well-developed in no acute distress.  Eyes: No icterus. Conjunctivae pink. Extremities: No lower extremity edema. No clubbing or deformities. Neuro: Alert and oriented x 3.  Grossly intact. Skin: Warm and dry, no jaundice.   Psych: Alert and cooperative, normal mood and affect.  Labs:    Imaging Studies: Nm Hepato W/eject Fract  Result Date: 04/21/2017 CLINICAL DATA:  RIGHT upper quadrant abdominal pain for 9-12 months especially after meals, gas pain radiating to back EXAM: NUCLEAR MEDICINE HEPATOBILIARY IMAGING WITH GALLBLADDER EF TECHNIQUE: Sequential images of the abdomen were obtained out to 60 minutes following intravenous administration of radiopharmaceutical. After oral ingestion of Ensure, gallbladder ejection fraction was determined. At 60 min, normal ejection fraction is greater than 33%. RADIOPHARMACEUTICALS:  5.42 mCi Tc-68m  Choletec IV COMPARISON:  None FINDINGS: Normal tracer extraction from bloodstream indicating normal hepatocellular function. Normal excretion of tracer into biliary tree. Gallbladder visualized at 27 min. Small bowel visualized at 19 min. No hepatic retention of tracer. Subjectively normal emptying of tracer from gallbladder following fatty meal stimulation. Calculated gallbladder ejection fraction is 86%, normal. Patient reported gas and LEFT abdominal pain following Ensure ingestion. Normal gallbladder ejection fraction following Ensure ingestion is greater than 33% at 1 hour. IMPRESSION: Patent biliary tree with normal gallbladder ejection fraction of 86% following fatty meal stimulation. Electronically Signed   By: Lavonia Dana M.D.   On: 04/21/2017 15:45   US Abdomen Limited Ruq  Result Date: 04/21/2017 CLINICAL DATA:  Right upper quadrant abdominal pain intermittently for 1 year. EXAM: ULTRASOUND ABDOMEN LIMITED RIGHT UPPER QUADRANT COMPARISON:  None. FINDINGS: Gallbladder: 10 x 8 x 14 mm polypoid mass projecting from the  gallbladder wall with evidence of internal vascularity on color Doppler imaging. Separate smaller soft tissue nodule measuring 5 mm. No gallstones or wall thickening visualized. No sonographic Murphy sign noted by sonographer. Common bile duct: Diameter: 3 mm proximally and distally with mild focal enlargement to 8 mm in its midportion, partially obscured by shadowing likely from bowel. Liver: No focal lesion identified. Within normal limits in parenchymal echogenicity. Portal vein is patent on color Doppler imaging with normal direction of blood flow towards the liver. IMPRESSION: 1. 14 mm gallbladder mass.  Surgical consultation is recommended. 2. Smaller gallbladder polyp measuring 5 mm. Electronically Signed   By: Logan Bores M.D.   On: 04/21/2017 09:09    Assessment and Plan:   Lacey Ramsey is a 66 y.o. 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 The patient will be sent to surgery for evaluation. The patient has been told that after the gallbladder has been removed if she continues to have the pain she will need to be set up for an upper endoscopy to rule out peptic ulcer disease. The patient has been explained the plan and agrees with it.    Lucilla Lame, MD. Marval Regal   Note: This dictation was prepared with Dragon dictation along with smaller phrase technology. Any transcriptional errors that result from this process are unintentional.

## 2017-04-24 DIAGNOSIS — E782 Mixed hyperlipidemia: Secondary | ICD-10-CM | POA: Diagnosis not present

## 2017-04-24 DIAGNOSIS — I493 Ventricular premature depolarization: Secondary | ICD-10-CM | POA: Diagnosis not present

## 2017-04-29 ENCOUNTER — Encounter: Payer: Self-pay | Admitting: Surgery

## 2017-04-29 ENCOUNTER — Ambulatory Visit (INDEPENDENT_AMBULATORY_CARE_PROVIDER_SITE_OTHER): Payer: Medicare Other | Admitting: Surgery

## 2017-04-29 VITALS — BP 138/70 | HR 80 | Temp 98.3°F | Ht 64.0 in | Wt 135.0 lb

## 2017-04-29 DIAGNOSIS — K828 Other specified diseases of gallbladder: Secondary | ICD-10-CM | POA: Diagnosis not present

## 2017-04-29 NOTE — Progress Notes (Signed)
04/29/2017  Reason for Visit:  Gallbladder mass  Referring Provider:  Lucilla Lame, MD  History of Present Illness: Lacey Ramsey is a 66 y.o. female who presents with a one year history of intermittent upper abdominal pain.  She reports that the pain happens after eating, and is strictly associated with meals.  It does not happen at other times.  Her pain usually starts within 30-60 minutes after eating and can be sharp and severe in nature at times.  Her pain is mostly in the epigastric area and radiates to her back.  Denies any nausea or vomiting, fevers or chills, chest pain or shortness of breath, constipation or diarrhea.  She does have IBS and acid reflux and has been on Protonix for 2 months without real improvement in her symptoms.  She was referred to GI and saw Dr. Allen Norris, who initiated workup with ultrasound and HIDA scan.  Her ultrasound showed a 5 mm gallbladder polyp and a 1.4 cm gallbladder mass.  Her HIDA scan was negative.  She denies any weight loss or decreased energy.  She had a recent cardiology workup for PVCs with Holter monitor and has not required further treatment and has been cleared for any potential surgery.  Past Medical History: Past Medical History:  Diagnosis Date  . Anxiety   . GERD (gastroesophageal reflux disease)   . Hypothyroidism   . IBS (irritable bowel syndrome)   . Insomnia      Past Surgical History: Past Surgical History:  Procedure Laterality Date  . APPENDECTOMY    . KNEE ARTHROSCOPY    . TUBAL LIGATION      Home Medications: Prior to Admission medications   Medication Sig Start Date End Date Taking? Authorizing Provider  amitriptyline (ELAVIL) 25 MG tablet Take 25 mg by mouth at bedtime.   Yes [provider]  levothyroxine (SYNTHROID, LEVOTHROID) 50 MCG tablet Take 50 mcg by mouth daily before breakfast.   Yes [provider]  pantoprazole (PROTONIX) 40 MG tablet Take 1 tablet (40 mg total) by mouth 2 (two) times  daily before a meal. 04/16/17  Yes Lucilla Lame, MD  traZODone (DESYREL) 50 MG tablet Take 50 mg by mouth 2 (two) times daily.   Yes [provider]    Allergies: Allergies  Allergen Reactions  . Codeine Nausea And Vomiting    Social History:  reports that  has never smoked. she has never used smokeless tobacco. She reports that she drinks alcohol. She reports that she does not use drugs.   Family History: Family History  Problem Relation Age of Onset  . Bone Cancer Mother   . Breast Cancer Sister   No history of hepatobiliary cancer or colon cancer.  Review of Systems: Review of Systems  Constitutional: Negative for chills and fever.  HENT: Negative for hearing loss.   Eyes: Negative for blurred vision.  Respiratory: Negative for shortness of breath.   Cardiovascular: Negative for chest pain.  Gastrointestinal: Positive for abdominal pain. Negative for constipation, diarrhea, nausea and vomiting.  Genitourinary: Negative for dysuria.  Musculoskeletal: Negative for myalgias.  Skin: Negative for rash.  Neurological: Negative for dizziness.  Psychiatric/Behavioral: Negative for depression.  All other systems reviewed and are negative.   Physical Exam BP 138/70   Pulse 80   Temp 98.3 F (36.8 C) (Oral)   Ht 5\' 4"  (1.626 m)   Wt 61.2 kg (135 lb)   BMI 23.17 kg/m  CONSTITUTIONAL: No acute distress HEENT:  Normocephalic, atraumatic, extraocular motion  intact. NECK: Trachea is midline, and there is no jugular venous distension.  RESPIRATORY:  Lungs are clear, and breath sounds are equal bilaterally. Normal respiratory effort without pathologic use of accessory muscles. CARDIOVASCULAR: Heart is regular without murmurs, gallops, or rubs. GI: The abdomen is soft, non-distended, non-tender to palpation.  Has two scars consistent with tubal ligation and open appendectomy which are well healed.  MUSCULOSKELETAL:  Normal muscle strength and tone in all four extremities.   No peripheral edema or cyanosis. SKIN: Skin turgor is normal. There are no pathologic skin lesions.  NEUROLOGIC:  Motor and sensation is grossly normal.  Cranial nerves are grossly intact. PSYCH:  Alert and oriented to person, place and time. Affect is normal.  Laboratory Analysis: Labs from 03/06/17 show normal LFTs, normal BMP with Cr 0.98 and normal CBC with WBC 3.7, Hgb 13.9.  Imaging: Ultrasound 04/21/17: IMPRESSION: 1. 14 mm gallbladder mass.  Surgical consultation is recommended. 2. Smaller gallbladder polyp measuring 5 mm.  HIDA 04/21/17: IMPRESSION: Patent biliary tree with normal gallbladder ejection fraction of 86% following fatty meal stimulation  Assessment and Plan: This is a 66 y.o. female who presents with post-prandial abdominal pain and workup revealing two gallbladder masses.  I have independently viewed the patient's imaging studies and laboratory studies.  Her ultrasound shows a smaller likely polyp measuring 5 mm and a larger one measuring 14 mm.  It is unclear in one portion of the ultrasound if the larger polyp is truly contained within the gallbladder or not.  She has no symptoms that would be suspicious for malignancy.  Discussed with the patient that I would want to obtain an MRI to better delineate this larger gallbladder mass to make sure that it is contained within the gallbladder and not having any suspicious characteristics.  If normal MRI, then we would proceed with laparoscopic cholecystectomy, tentatively scheduled for 05/13/17.  If there are any suspicious findings on MRI, then we would bring her back to office to discuss further plans and steps.  The details of laparoscopic cholecystectomy where discussed with the patient at length, including risks of bleeding, infection, and injury to surrounding structures, as well as the post-op outcomes and restrictions.  She is willing to proceed and understands the need for further workup with MRI first.  Patient  understands this plan and all of her questions have been answered.     Melvyn Neth, [place]

## 2017-04-29 NOTE — Patient Instructions (Signed)
Please arrive to the [facility] on 05/05/2017 at 8:30 AM for your appointment at 8:45 AM. You will go to the registration desk and register. Then they will do your MRI.   Remember NOTHING to EAT or DRINK four hours prior.  Please go to your nearest location to pick up your prep-kit.  Your surgery will be scheduled on 05/13/2017 by Dr. Hampton Abbot at [facility]). Please look at your blue sheet in case you have any questions.

## 2017-04-29 NOTE — H&P (View-Only) (Signed)
04/29/2017  Reason for Visit:  Gallbladder mass  Referring Provider:  Lucilla Lame, MD  History of Present Illness: Lacey Ramsey is a 66 y.o. female who presents with a one year history of intermittent upper abdominal pain.  She reports that the pain happens after eating, and is strictly associated with meals.  It does not happen at other times.  Her pain usually starts within 30-60 minutes after eating and can be sharp and severe in nature at times.  Her pain is mostly in the epigastric area and radiates to her back.  Denies any nausea or vomiting, fevers or chills, chest pain or shortness of breath, constipation or diarrhea.  She does have IBS and acid reflux and has been on Protonix for 2 months without real improvement in her symptoms.  She was referred to GI and saw Dr. Allen Norris, who initiated workup with ultrasound and HIDA scan.  Her ultrasound showed a 5 mm gallbladder polyp and a 1.4 cm gallbladder mass.  Her HIDA scan was negative.  She denies any weight loss or decreased energy.  She had a recent cardiology workup for PVCs with Holter monitor and has not required further treatment and has been cleared for any potential surgery.  Past Medical History: Past Medical History:  Diagnosis Date  . Anxiety   . GERD (gastroesophageal reflux disease)   . Hypothyroidism   . IBS (irritable bowel syndrome)   . Insomnia      Past Surgical History: Past Surgical History:  Procedure Laterality Date  . APPENDECTOMY    . KNEE ARTHROSCOPY    . TUBAL LIGATION      Home Medications: Prior to Admission medications   Medication Sig Start Date End Date Taking? Authorizing Provider  amitriptyline (ELAVIL) 25 MG tablet Take 25 mg by mouth at bedtime.   Yes [provider]  levothyroxine (SYNTHROID, LEVOTHROID) 50 MCG tablet Take 50 mcg by mouth daily before breakfast.   Yes [provider]  pantoprazole (PROTONIX) 40 MG tablet Take 1 tablet (40 mg total) by mouth 2 (two) times  daily before a meal. 04/16/17  Yes Lucilla Lame, MD  traZODone (DESYREL) 50 MG tablet Take 50 mg by mouth 2 (two) times daily.   Yes [provider]    Allergies: Allergies  Allergen Reactions  . Codeine Nausea And Vomiting    Social History:  reports that  has never smoked. she has never used smokeless tobacco. She reports that she drinks alcohol. She reports that she does not use drugs.   Family History: Family History  Problem Relation Age of Onset  . Bone Cancer Mother   . Breast Cancer Sister   No history of hepatobiliary cancer or colon cancer.  Review of Systems: Review of Systems  Constitutional: Negative for chills and fever.  HENT: Negative for hearing loss.   Eyes: Negative for blurred vision.  Respiratory: Negative for shortness of breath.   Cardiovascular: Negative for chest pain.  Gastrointestinal: Positive for abdominal pain. Negative for constipation, diarrhea, nausea and vomiting.  Genitourinary: Negative for dysuria.  Musculoskeletal: Negative for myalgias.  Skin: Negative for rash.  Neurological: Negative for dizziness.  Psychiatric/Behavioral: Negative for depression.  All other systems reviewed and are negative.   Physical Exam BP 138/70   Pulse 80   Temp 98.3 F (36.8 C) (Oral)   Ht 5\' 4"  (1.626 m)   Wt 61.2 kg (135 lb)   BMI 23.17 kg/m  CONSTITUTIONAL: No acute distress HEENT:  Normocephalic, atraumatic, extraocular motion  intact. NECK: Trachea is midline, and there is no jugular venous distension.  RESPIRATORY:  Lungs are clear, and breath sounds are equal bilaterally. Normal respiratory effort without pathologic use of accessory muscles. CARDIOVASCULAR: Heart is regular without murmurs, gallops, or rubs. GI: The abdomen is soft, non-distended, non-tender to palpation.  Has two scars consistent with tubal ligation and open appendectomy which are well healed.  MUSCULOSKELETAL:  Normal muscle strength and tone in all four extremities.   No peripheral edema or cyanosis. SKIN: Skin turgor is normal. There are no pathologic skin lesions.  NEUROLOGIC:  Motor and sensation is grossly normal.  Cranial nerves are grossly intact. PSYCH:  Alert and oriented to person, place and time. Affect is normal.  Laboratory Analysis: Labs from 03/06/17 show normal LFTs, normal BMP with Cr 0.98 and normal CBC with WBC 3.7, Hgb 13.9.  Imaging: Ultrasound 04/21/17: IMPRESSION: 1. 14 mm gallbladder mass.  Surgical consultation is recommended. 2. Smaller gallbladder polyp measuring 5 mm.  HIDA 04/21/17: IMPRESSION: Patent biliary tree with normal gallbladder ejection fraction of 86% following fatty meal stimulation  Assessment and Plan: This is a 66 y.o. female who presents with post-prandial abdominal pain and workup revealing two gallbladder masses.  I have independently viewed the patient's imaging studies and laboratory studies.  Her ultrasound shows a smaller likely polyp measuring 5 mm and a larger one measuring 14 mm.  It is unclear in one portion of the ultrasound if the larger polyp is truly contained within the gallbladder or not.  She has no symptoms that would be suspicious for malignancy.  Discussed with the patient that I would want to obtain an MRI to better delineate this larger gallbladder mass to make sure that it is contained within the gallbladder and not having any suspicious characteristics.  If normal MRI, then we would proceed with laparoscopic cholecystectomy, tentatively scheduled for 05/13/17.  If there are any suspicious findings on MRI, then we would bring her back to office to discuss further plans and steps.  The details of laparoscopic cholecystectomy where discussed with the patient at length, including risks of bleeding, infection, and injury to surrounding structures, as well as the post-op outcomes and restrictions.  She is willing to proceed and understands the need for further workup with MRI first.  Patient  understands this plan and all of her questions have been answered.     Melvyn Neth, [place]

## 2017-04-30 ENCOUNTER — Telehealth: Payer: Self-pay | Admitting: Surgery

## 2017-04-30 NOTE — Telephone Encounter (Signed)
I have called patient to go over her surgery information. No answer. I was not able to leave a message on the voicemail.    pre op date/time and sx date. Sx: 05/13/17 with Dr Piscoya--laparoscopic cholecystectomy.  Pre op: 05/05/17 between 9-1:00pm--phone interview.   Patient made aware to call [phone], between 1-3:00pm the day before surgery, to find out what time to arrive.

## 2017-05-01 NOTE — Telephone Encounter (Signed)
I have called patient again to go over surgery and pre op information. No answer and not able to leave a message on voicemail.

## 2017-05-02 NOTE — Telephone Encounter (Signed)
I have contacted patient and all surgery information was given. Patient stated that she has a MRI scheduled on 12/10, however she is concerned that it will be rescheduled due to the weather. I have advised her that if it is rescheduled, that she will need to let them know that this needs to be scheduled before her surgery on 12/18.

## 2017-05-02 NOTE — Telephone Encounter (Signed)
Called Central [facility] and they stated that they will not close on Monday 05/05/2017 for inclement weather. They stated that if by any chance, Radiology Department wants to close, then they will contact patients to reschedule.

## 2017-05-05 ENCOUNTER — Encounter
Admission: RE | Admit: 2017-05-05 | Discharge: 2017-05-05 | Disposition: A | Discharge status: Home / Self Care | Payer: Medicare Other | Source: Ambulatory Visit | Attending: Surgery | Admitting: Surgery

## 2017-05-05 ENCOUNTER — Ambulatory Visit
Admission: RE | Admit: 2017-05-05 | Discharge: 2017-05-05 | Disposition: A | Discharge status: Home / Self Care | Payer: Medicare Other | Source: Ambulatory Visit | Attending: Surgery | Admitting: Surgery

## 2017-05-05 ENCOUNTER — Other Ambulatory Visit: Payer: Self-pay

## 2017-05-05 DIAGNOSIS — N281 Cyst of kidney, acquired: Secondary | ICD-10-CM | POA: Diagnosis not present

## 2017-05-05 DIAGNOSIS — K824 Cholesterolosis of gallbladder: Secondary | ICD-10-CM | POA: Diagnosis not present

## 2017-05-05 DIAGNOSIS — K828 Other specified diseases of gallbladder: Secondary | ICD-10-CM | POA: Diagnosis not present

## 2017-05-05 DIAGNOSIS — K449 Diaphragmatic hernia without obstruction or gangrene: Secondary | ICD-10-CM | POA: Insufficient documentation

## 2017-05-05 LAB — POCT I-STAT CREATININE: CREATININE: 1 mg/dL (ref 0.44–1.00)

## 2017-05-05 MED ORDER — GADOBENATE DIMEGLUMINE 529 MG/ML IV SOLN
15.0000 mL | Freq: Once | INTRAVENOUS | Status: AC | PRN
  Administered 2017-05-05: 12 mL via INTRAVENOUS

## 2017-05-05 NOTE — Patient Instructions (Signed)
  Your procedure is scheduled on: Tuesday May 13, 2017 Report to [facility]  To find out your arrival time please call [phone] between 1PM - 3PM on Monday May 12, 2017   Remember: Instructions that are not followed completely may result in serious medical risk, up to and including death, or upon the discretion of your surgeon and anesthesiologist your surgery may need to be rescheduled.     _X__ 1. Do not eat food after midnight the night before your procedure.                 No gum chewing or hard candies. You may drink clear liquids up to 2 hours                 before you are scheduled to arrive for your surgery- DO not drink clear                 liquids within 2 hours of the start of your surgery.                 Clear Liquids include:  water, apple juice without pulp, clear                 Gatorade, Black Coffee or Tea (Do not add                 anything to coffee or tea).     _X__ 2.  No Alcohol for 24 hours before or after surgery.   _X__ 3.  Do Not Smoke or use e-cigarettes For 24 Hours Prior to Your Surgery.                 Do not use any chewable tobacco products for at least 6 hours prior to                 surgery.  ____  4.  Bring all medications with you on the day of surgery if instructed.   __X__  5.  Notify your doctor if there is any change in your medical condition      (cold, fever, infections).     Do not wear jewelry, make-up, hairpins, clips or nail polish. Do not wear lotions, powders, or perfumes. You may NOT wear deodorant. Do not shave 48 hours prior to surgery. Men may shave face and neck. Do not bring valuables to the hospital.    [facility] is not responsible for any belongings or valuables.  Contacts, dentures or bridgework may not be worn into surgery. Leave your suitcase in the car. After surgery it may be brought to your room. For patients admitted to the hospital, discharge time is determined  by your treatment team.   Patients discharged the day of surgery will not be allowed to drive home.   Please read over the following fact sheets that you were given:   CHG INSTRUCTION  ___X_ Take these medicines the morning of surgery with A SIP OF WATER:    1. PANTOPRAZOLE  2. SYNTHROID   3.   4.  5.  6.    _X___ Use CHG Soap as directed  ____ Stop Anti-inflammatories. TAKE TYLENOL

## 2017-05-06 ENCOUNTER — Telehealth: Payer: Self-pay | Admitting: Surgery

## 2017-05-06 NOTE — Telephone Encounter (Signed)
Spoke with patient at this time regarding MRI results.   I let her know as soon as Dr.Piscoya reviewed the results I would get back with her.

## 2017-05-06 NOTE — Telephone Encounter (Signed)
Patient would like to discuss MRI results. Please advise

## 2017-05-07 NOTE — Telephone Encounter (Signed)
Dr. Hampton Abbot let me know that he had spoken to the patient and discussed her MRI results.

## 2017-05-12 MED ORDER — CEFAZOLIN SODIUM-DEXTROSE 2-4 GM/100ML-% IV SOLN
2.0000 g | INTRAVENOUS | Status: AC
  Administered 2017-05-13: 2 g via INTRAVENOUS

## 2017-05-13 ENCOUNTER — Ambulatory Visit: Payer: Medicare Other | Admitting: Registered Nurse

## 2017-05-13 ENCOUNTER — Encounter: Payer: Self-pay | Admitting: *Deleted

## 2017-05-13 ENCOUNTER — Encounter
Admission: RE | Disposition: A | Discharge status: Home / Self Care | Payer: Self-pay | Source: Ambulatory Visit | Attending: Surgery

## 2017-05-13 ENCOUNTER — Ambulatory Visit
Admission: RE | Admit: 2017-05-13 | Discharge: 2017-05-13 | Disposition: A | Discharge status: Home / Self Care | Payer: Medicare Other | Source: Ambulatory Visit | Attending: Surgery | Admitting: Surgery

## 2017-05-13 ENCOUNTER — Other Ambulatory Visit: Payer: Self-pay

## 2017-05-13 DIAGNOSIS — E039 Hypothyroidism, unspecified: Secondary | ICD-10-CM | POA: Insufficient documentation

## 2017-05-13 DIAGNOSIS — K824 Cholesterolosis of gallbladder: Secondary | ICD-10-CM | POA: Diagnosis present

## 2017-05-13 DIAGNOSIS — E785 Hyperlipidemia, unspecified: Secondary | ICD-10-CM | POA: Diagnosis not present

## 2017-05-13 DIAGNOSIS — G4709 Other insomnia: Secondary | ICD-10-CM | POA: Diagnosis not present

## 2017-05-13 DIAGNOSIS — Z885 Allergy status to narcotic agent status: Secondary | ICD-10-CM | POA: Insufficient documentation

## 2017-05-13 DIAGNOSIS — K828 Other specified diseases of gallbladder: Secondary | ICD-10-CM | POA: Insufficient documentation

## 2017-05-13 DIAGNOSIS — Z803 Family history of malignant neoplasm of breast: Secondary | ICD-10-CM | POA: Diagnosis not present

## 2017-05-13 DIAGNOSIS — K8010 Calculus of gallbladder with chronic cholecystitis without obstruction: Secondary | ICD-10-CM | POA: Insufficient documentation

## 2017-05-13 DIAGNOSIS — Z808 Family history of malignant neoplasm of other organs or systems: Secondary | ICD-10-CM | POA: Insufficient documentation

## 2017-05-13 DIAGNOSIS — F419 Anxiety disorder, unspecified: Secondary | ICD-10-CM | POA: Diagnosis not present

## 2017-05-13 DIAGNOSIS — K589 Irritable bowel syndrome without diarrhea: Secondary | ICD-10-CM | POA: Diagnosis not present

## 2017-05-13 DIAGNOSIS — Z79899 Other long term (current) drug therapy: Secondary | ICD-10-CM | POA: Insufficient documentation

## 2017-05-13 DIAGNOSIS — I493 Ventricular premature depolarization: Secondary | ICD-10-CM | POA: Insufficient documentation

## 2017-05-13 DIAGNOSIS — G4700 Insomnia, unspecified: Secondary | ICD-10-CM | POA: Diagnosis not present

## 2017-05-13 DIAGNOSIS — K219 Gastro-esophageal reflux disease without esophagitis: Secondary | ICD-10-CM | POA: Diagnosis not present

## 2017-05-13 DIAGNOSIS — F411 Generalized anxiety disorder: Secondary | ICD-10-CM | POA: Diagnosis not present

## 2017-05-13 HISTORY — PX: CHOLECYSTECTOMY: SHX55

## 2017-05-13 SURGERY — LAPAROSCOPIC CHOLECYSTECTOMY
Anesthesia: General | Site: Abdomen | Wound class: Clean Contaminated

## 2017-05-13 MED ORDER — ONDANSETRON HCL 4 MG/2ML IJ SOLN
INTRAMUSCULAR | Status: AC
  Filled 2017-05-13: qty 2

## 2017-05-13 MED ORDER — SUGAMMADEX SODIUM 200 MG/2ML IV SOLN
INTRAVENOUS | Status: AC
  Filled 2017-05-13: qty 2

## 2017-05-13 MED ORDER — GABAPENTIN 300 MG PO CAPS
300.0000 mg | ORAL_CAPSULE | ORAL | Status: AC
  Administered 2017-05-13: 300 mg via ORAL

## 2017-05-13 MED ORDER — PHENYLEPHRINE HCL 10 MG/ML IJ SOLN
INTRAMUSCULAR | Status: DC | PRN
  Administered 2017-05-13 (×6): 100 ug via INTRAVENOUS

## 2017-05-13 MED ORDER — CHLORHEXIDINE GLUCONATE CLOTH 2 % EX PADS: 6.0000 | MEDICATED_PAD | Freq: Once | CUTANEOUS | Status: DC

## 2017-05-13 MED ORDER — PROPOFOL 10 MG/ML IV BOLUS
INTRAVENOUS | Status: AC
  Filled 2017-05-13: qty 20

## 2017-05-13 MED ORDER — LIDOCAINE HCL (CARDIAC) 20 MG/ML IV SOLN
INTRAVENOUS | Status: DC | PRN
  Administered 2017-05-13: 100 mg via INTRAVENOUS

## 2017-05-13 MED ORDER — ACETAMINOPHEN 500 MG PO TABS
1000.0000 mg | ORAL_TABLET | ORAL | Status: AC
Start: 2017-05-13 — End: 2017-05-13
  Administered 2017-05-13: 1000 mg via ORAL

## 2017-05-13 MED ORDER — BUPIVACAINE-EPINEPHRINE (PF) 0.25% -1:200000 IJ SOLN
INTRAMUSCULAR | Status: AC
  Filled 2017-05-13: qty 30

## 2017-05-13 MED ORDER — IBUPROFEN 600 MG PO TABS: 600.0000 mg | ORAL_TABLET | Freq: Three times a day (TID) | ORAL | 0 refills | Status: DC | PRN

## 2017-05-13 MED ORDER — GABAPENTIN 300 MG PO CAPS
ORAL_CAPSULE | ORAL | Status: AC
  Administered 2017-05-13: 300 mg via ORAL
  Filled 2017-05-13: qty 1

## 2017-05-13 MED ORDER — ONDANSETRON HCL 4 MG/2ML IJ SOLN
4.0000 mg | Freq: Once | INTRAMUSCULAR | Status: AC | PRN
  Administered 2017-05-13: 4 mg via INTRAVENOUS

## 2017-05-13 MED ORDER — BUPIVACAINE-EPINEPHRINE 0.25% -1:200000 IJ SOLN
INTRAMUSCULAR | Status: DC | PRN
  Administered 2017-05-13: 30 mL

## 2017-05-13 MED ORDER — ONDANSETRON HCL 4 MG/2ML IJ SOLN
INTRAMUSCULAR | Status: DC | PRN
  Administered 2017-05-13: 4 mg via INTRAVENOUS

## 2017-05-13 MED ORDER — OXYCODONE HCL 5 MG PO TABS: 5.0000 mg | ORAL_TABLET | Freq: Four times a day (QID) | ORAL | 0 refills | Status: DC | PRN

## 2017-05-13 MED ORDER — FENTANYL CITRATE (PF) 250 MCG/5ML IJ SOLN
INTRAMUSCULAR | Status: AC
  Filled 2017-05-13: qty 5

## 2017-05-13 MED ORDER — ROCURONIUM BROMIDE 100 MG/10ML IV SOLN
INTRAVENOUS | Status: DC | PRN
  Administered 2017-05-13: 20 mg via INTRAVENOUS
  Administered 2017-05-13: 30 mg via INTRAVENOUS

## 2017-05-13 MED ORDER — ROCURONIUM BROMIDE 50 MG/5ML IV SOLN
INTRAVENOUS | Status: AC
  Filled 2017-05-13: qty 1

## 2017-05-13 MED ORDER — DEXAMETHASONE SODIUM PHOSPHATE 10 MG/ML IJ SOLN
INTRAMUSCULAR | Status: DC | PRN
  Administered 2017-05-13: 10 mg via INTRAVENOUS

## 2017-05-13 MED ORDER — MIDAZOLAM HCL 2 MG/2ML IJ SOLN
INTRAMUSCULAR | Status: AC
  Filled 2017-05-13: qty 2

## 2017-05-13 MED ORDER — ACETAMINOPHEN 500 MG PO TABS
ORAL_TABLET | ORAL | Status: AC
  Administered 2017-05-13: 1000 mg via ORAL
  Filled 2017-05-13: qty 2

## 2017-05-13 MED ORDER — FENTANYL CITRATE (PF) 100 MCG/2ML IJ SOLN
INTRAMUSCULAR | Status: AC
  Administered 2017-05-13: 25 ug via INTRAVENOUS
  Filled 2017-05-13: qty 2

## 2017-05-13 MED ORDER — LACTATED RINGERS IV SOLN
INTRAVENOUS | Status: DC
  Administered 2017-05-13 (×2): via INTRAVENOUS

## 2017-05-13 MED ORDER — FENTANYL CITRATE (PF) 100 MCG/2ML IJ SOLN
25.0000 ug | INTRAMUSCULAR | Status: DC | PRN
  Administered 2017-05-13 (×4): 25 ug via INTRAVENOUS

## 2017-05-13 MED ORDER — PROPOFOL 10 MG/ML IV BOLUS
INTRAVENOUS | Status: DC | PRN
  Administered 2017-05-13: 150 mg via INTRAVENOUS

## 2017-05-13 MED ORDER — FENTANYL CITRATE (PF) 100 MCG/2ML IJ SOLN
INTRAMUSCULAR | Status: DC | PRN
  Administered 2017-05-13 (×2): 50 ug via INTRAVENOUS

## 2017-05-13 MED ORDER — CEFAZOLIN SODIUM-DEXTROSE 2-4 GM/100ML-% IV SOLN
INTRAVENOUS | Status: AC
  Filled 2017-05-13: qty 100

## 2017-05-13 SURGICAL SUPPLY — 42 items
APPLIER CLIP 5 13 M/L LIGAMAX5 (MISCELLANEOUS) ×3
BLADE SURG 15 STRL LF DISP TIS (BLADE) ×1 IMPLANT
BLADE SURG 15 STRL SS (BLADE) ×2
CANISTER SUCT 1200ML W/VALVE (MISCELLANEOUS) ×3 IMPLANT
CATH CHOLANGI 4FR 420404F (CATHETERS) IMPLANT
CHLORAPREP W/TINT 26ML (MISCELLANEOUS) ×3 IMPLANT
CLIP APPLIE 5 13 M/L LIGAMAX5 (MISCELLANEOUS) ×1 IMPLANT
CONRAY 60ML FOR OR (MISCELLANEOUS) IMPLANT
DERMABOND ADVANCED (GAUZE/BANDAGES/DRESSINGS) ×2
DERMABOND ADVANCED .7 DNX12 (GAUZE/BANDAGES/DRESSINGS) ×1 IMPLANT
DRAPE C-ARM XRAY 36X54 (DRAPES) IMPLANT
ELECT REM PT RETURN 9FT ADLT (ELECTROSURGICAL) ×3
ELECTRODE REM PT RTRN 9FT ADLT (ELECTROSURGICAL) ×1 IMPLANT
FILTER LAP SMOKE EVAC STRL (MISCELLANEOUS) ×3 IMPLANT
GLOVE SURG SYN 7.0 (GLOVE) ×3 IMPLANT
GLOVE SURG SYN 7.5  E (GLOVE) ×2
GLOVE SURG SYN 7.5 E (GLOVE) ×1 IMPLANT
GOWN STRL REUS W/ TWL LRG LVL3 (GOWN DISPOSABLE) ×3 IMPLANT
GOWN STRL REUS W/TWL LRG LVL3 (GOWN DISPOSABLE) ×6
IRRIGATION STRYKERFLOW (MISCELLANEOUS) IMPLANT
IRRIGATOR STRYKERFLOW (MISCELLANEOUS)
IV CATH ANGIO 12GX3 LT BLUE (NEEDLE) ×3 IMPLANT
IV NS 1000ML (IV SOLUTION)
IV NS 1000ML BAXH (IV SOLUTION) IMPLANT
JACKSON PRATT 10 (INSTRUMENTS) IMPLANT
L-HOOK LAP DISP 36CM (ELECTROSURGICAL) ×3
LABEL OR SOLS (LABEL) ×3 IMPLANT
LHOOK LAP DISP 36CM (ELECTROSURGICAL) ×1 IMPLANT
NEEDLE HYPO 22GX1.5 SAFETY (NEEDLE) ×6 IMPLANT
PACK LAP CHOLECYSTECTOMY (MISCELLANEOUS) ×3 IMPLANT
PENCIL ELECTRO HAND CTR (MISCELLANEOUS) ×3 IMPLANT
POUCH SPECIMEN RETRIEVAL 10MM (ENDOMECHANICALS) ×3 IMPLANT
SCISSORS METZENBAUM CVD 33 (INSTRUMENTS) ×3 IMPLANT
SLEEVE ADV FIXATION 5X100MM (TROCAR) ×9 IMPLANT
SPONGE VERSALON 4X4 4PLY (MISCELLANEOUS) IMPLANT
SUT MNCRL 4-0 (SUTURE) ×2
SUT MNCRL 4-0 27XMFL (SUTURE) ×1
SUT VICRYL 0 AB UR-6 (SUTURE) ×3 IMPLANT
SUTURE MNCRL 4-0 27XMF (SUTURE) ×1 IMPLANT
TROCAR BALLN GELPORT 12X130M (ENDOMECHANICALS) ×3 IMPLANT
TROCAR Z-THREAD OPTICAL 5X100M (TROCAR) ×3 IMPLANT
TUBING INSUFFLATION (TUBING) ×3 IMPLANT

## 2017-05-13 NOTE — Discharge Instructions (Signed)
Laparoscopic Cholecystectomy, Care After °This sheet gives you information about how to care for yourself after your procedure. Your health care provider may also give you more specific instructions. If you have problems or questions, contact your health care provider. °What can I expect after the procedure? °After the procedure, it is common to have: °· Pain at your incision sites. You will be given medicines to control this pain. °· Mild nausea or vomiting. °· Bloating and possible shoulder pain from the air-like gas that was used during the procedure. ° °Follow these instructions at home: °Incision care ° °· Follow instructions from your health care provider about how to take care of your incisions. Make sure you: °? Wash your hands with soap and water before you change your bandage (dressing). If soap and water are not available, use hand sanitizer. °? Change your dressing as told by your health care provider. °? Leave stitches (sutures), skin glue, or adhesive strips in place. These skin closures may need to be in place for 2 weeks or longer. If adhesive strip edges start to loosen and curl up, you may trim the loose edges. Do not remove adhesive strips completely unless your health care provider tells you to do that. °· Do not take baths, swim, or use a hot tub until your health care provider approves. Ask your health care provider if you can take showers. You may only be allowed to take sponge baths for bathing. °· Check your incision area every day for signs of infection. Check for: °? More redness, swelling, or pain. °? More fluid or blood. °? Warmth. °? Pus or a bad smell. °Activity °· Do not drive or use heavy machinery while taking prescription pain medicine. °· Do not lift anything that is heavier than 10 lb (4.5 kg) until your health care provider approves. °· Do not play contact sports until your health care provider approves. °· Do not drive for 24 hours if you were given a medicine to help you relax  (sedative). °· Rest as needed. Do not return to work or school until your health care provider approves. °General instructions °· Take over-the-counter and prescription medicines only as told by your health care provider. °· To prevent or treat constipation while you are taking prescription pain medicine, your health care provider may recommend that you: °? Drink enough fluid to keep your urine clear or pale yellow. °? Take over-the-counter or prescription medicines. °? Eat foods that are high in fiber, such as fresh fruits and vegetables, whole grains, and beans. °? Limit foods that are high in fat and processed sugars, such as fried and sweet foods. °Contact a health care provider if: °· You develop a rash. °· You have more redness, swelling, or pain around your incisions. °· You have more fluid or blood coming from your incisions. °· Your incisions feel warm to the touch. °· You have pus or a bad smell coming from your incisions. °· You have a fever. °· One or more of your incisions breaks open. °Get help right away if: °· You have trouble breathing. °· You have chest pain. °· You have increasing pain in your shoulders. °· You faint or feel dizzy when you stand. °· You have severe pain in your abdomen. °· You have nausea or vomiting that lasts for more than one day. °· You have leg pain. °This information is not intended to replace advice given to you by your health care provider. Make sure you discuss any questions you   have with your health care provider. Document Released: 05/13/2005 Document Revised: 12/02/2015 Document Reviewed: 10/30/2015 Elsevier Interactive Patient Education  2017 [place]   1) The drugs that you were given will stay in your system until tomorrow so for the next 24 hours you should not:  A) Drive an automobile B) Make any legal decisions C) Drink any alcoholic beverage   2) You may resume regular meals tomorrow.  Today it is  better to start with liquids and gradually work up to solid foods.  You may eat anything you prefer, but it is better to start with liquids, then soup and crackers, and gradually work up to solid foods.   3) Please notify your doctor immediately if you have any unusual bleeding, trouble breathing, redness and pain at the surgery site, drainage, fever, or pain not relieved by medication.    4) Additional Instructions:        Please contact your physician with any problems or Same Day Surgery at [phone], Monday through Friday 6 am to 4 pm, or [place] at [facility] number at [phone].

## 2017-05-13 NOTE — Anesthesia Preprocedure Evaluation (Addendum)
Anesthesia Evaluation  Patient identified by MRN, date of birth, ID band Patient awake    Reviewed: Allergy & Precautions, NPO status , Patient's Chart, lab work & pertinent test results, reviewed documented beta blocker date and time   Airway Mallampati: II  TM Distance: >3 FB     Dental  (+) Chipped, Missing   Pulmonary           Cardiovascular      Neuro/Psych PSYCHIATRIC DISORDERS Anxiety    GI/Hepatic GERD  Controlled,  Endo/Other  Hypothyroidism   Renal/GU      Musculoskeletal   Abdominal   Peds  Hematology   Anesthesia Other Findings Frequent PVCs.  Reproductive/Obstetrics                            Anesthesia Physical Anesthesia Plan  ASA: III  Anesthesia Plan: General   Post-op Pain Management:    Induction: Intravenous  PONV Risk Score and Plan:   Airway Management Planned: Oral ETT  Additional Equipment:   Intra-op Plan:   Post-operative Plan:   Informed Consent: I have reviewed the patients History and Physical, chart, labs and discussed the procedure including the risks, benefits and alternatives for the proposed anesthesia with the patient or authorized representative who has indicated his/her understanding and acceptance.     Plan Discussed with: CRNA  Anesthesia Plan Comments:         Anesthesia Quick Evaluation

## 2017-05-13 NOTE — OR Nursing (Signed)
Discharge instructions discussred with pt and family. Both voice understanding.

## 2017-05-13 NOTE — Transfer of Care (Signed)
Immediate Anesthesia Transfer of Care Note  Patient: Lacey Ramsey  Procedure(s) Performed: LAPAROSCOPIC CHOLECYSTECTOMY (N/A Abdomen)  Patient Location: PACU  Anesthesia Type:General  Level of Consciousness: sedated  Airway & Oxygen Therapy: Patient Spontanous Breathing and Patient connected to face mask oxygen  Post-op Assessment: Report given to RN and Post -op Vital signs reviewed and stable  Post vital signs: Reviewed and stable  Last Vitals:  Vitals:   05/13/17 1053  BP: (!) 149/71  Pulse: 70  Resp: 18  Temp: 36.6 C  SpO2: 100%    Last Pain:  Vitals:   05/13/17 1053  TempSrc: Oral         Complications: No apparent anesthesia complications

## 2017-05-13 NOTE — Interval H&P Note (Signed)
History and Physical Interval Note:  05/13/2017 10:59 AM  Lacey Ramsey  has presented today for surgery, with the diagnosis of gallbladder mass  The various methods of treatment have been discussed with the patient and family. After consideration of risks, benefits and other options for treatment, the patient has consented to  Procedure(s): LAPAROSCOPIC CHOLECYSTECTOMY (N/A) as a surgical intervention .  The patient's history has been reviewed, patient examined, no change in status, stable for surgery.  I have reviewed the patient's chart and labs.  Questions were answered to the patient's satisfaction.     Ronia Hazelett

## 2017-05-13 NOTE — Anesthesia Procedure Notes (Signed)
Procedure Name: Intubation Date/Time: 05/13/2017 2:38 PM Performed by: Justus Memory, CRNA Pre-anesthesia Checklist: Patient identified, Patient being monitored, Timeout performed, Emergency Drugs available and Suction available Patient Re-evaluated:Patient Re-evaluated prior to induction Oxygen Delivery Method: Circle system utilized Preoxygenation: Pre-oxygenation with 100% oxygen Induction Type: IV induction Ventilation: Mask ventilation without difficulty Laryngoscope Size: Mac and 3 Grade View: Grade I Tube type: Oral Tube size: 7.0 mm Number of attempts: 1 Airway Equipment and Method: Stylet Placement Confirmation: ETT inserted through vocal cords under direct vision,  positive ETCO2 and breath sounds checked- equal and bilateral Secured at: 21 cm Tube secured with: Tape Dental Injury: Teeth and Oropharynx as per pre-operative assessment

## 2017-05-13 NOTE — Anesthesia Post-op Follow-up Note (Signed)
Anesthesia QCDR form completed.        

## 2017-05-13 NOTE — Op Note (Signed)
  Procedure Date:  05/13/2017  Pre-operative Diagnosis:  Gallbladder mass  Post-operative Diagnosis:  Gallbladder mass  Procedure:  Laparoscopic cholecystectomy  Surgeon:  Melvyn Neth, MD  Anesthesia:  General endotracheal  Estimated Blood Loss:  3 ml  Specimens:  gallbladder  Complications:  None  Indications for Procedure:  This is a 66 y.o. female who presents with abdominal pain and workup revealing a 14 mm gallbladder mass on ultrasound.  Given the size of the mass, it was recommended that she undergo cholecystectomy.  The benefits, complications, treatment options, and expected outcomes were discussed with the patient. The risks of bleeding, infection, recurrence of symptoms, failure to resolve symptoms, bile duct damage, bile duct leak, retained common bile duct stone, bowel injury, and need for further procedures were all discussed with the patient and she was willing to proceed.  Description of Procedure: The patient was correctly identified in the preoperative area and brought into the operating room.  The patient was placed supine with VTE prophylaxis in place.  Appropriate time-outs were performed.  Anesthesia was induced and the patient was intubated.  Appropriate antibiotics were infused.  The abdomen was prepped and draped in a sterile fashion. An infraumbilical incision was made. A cutdown technique was used to enter the abdominal cavity without injury, and a Hasson trocar was inserted.  Pneumoperitoneum was obtained with appropriate opening pressures.  A 5-mm port was placed in the subxiphoid area and two 5-mm ports were placed in the right upper quadrant under direct visualization.  The gallbladder was identified.  The fundus was grasped and retracted cephalad.  Adhesions were lysed bluntly and with electrocautery. The infundibulum was grasped and retracted laterally, exposing the peritoneum overlying the gallbladder.  This was incised with electrocautery and extended  on either side of the gallbladder.  The cystic duct and cystic artery were clearly identified and dissected.  Both were clipped twice proximally and once distally, cutting in between.  The gallbladder was taken from the gallbladder fossa in a retrograde fashion with electrocautery. The gallbladder was placed in an Endocatch bag and brought out via the umbilical incision. The liver bed was inspected and any bleeding was controlled with electrocautery. The right upper quadrant was then inspected again revealing intact clips, no bleeding, and no ductal injury.  The 5 mm ports were removed under direct visualization and the Hasson trocar was removed.  The fascial opening was closed using 0 vicryl suture.  Local anesthetic was infused in all incisions and the incisions were closed with 4-0 Monocryl.  The wounds were cleaned and sealed with DermaBond.  The patient was emerged from anesthesia and extubated and brought to the recovery room for further management.  The patient tolerated the procedure well and all counts were correct at the end of the case.   Melvyn Neth, MD

## 2017-05-13 NOTE — Anesthesia Postprocedure Evaluation (Signed)
Anesthesia Post Note  Patient: Lacey Ramsey  Procedure(s) Performed: LAPAROSCOPIC CHOLECYSTECTOMY (N/A Abdomen)  Patient location during evaluation: PACU Anesthesia Type: General Level of consciousness: awake and alert Pain management: pain level controlled Vital Signs Assessment: post-procedure vital signs reviewed and stable Respiratory status: spontaneous breathing, nonlabored ventilation, respiratory function stable and patient connected to nasal cannula oxygen Cardiovascular status: blood pressure returned to baseline and stable Postop Assessment: no apparent nausea or vomiting Anesthetic complications: no     Last Vitals:  Vitals:   05/13/17 1658 05/13/17 1710  BP: 120/67 116/66  Pulse:    Resp: 17 16  Temp: (!) 36.2 C   SpO2: 100% 100%    Last Pain:  Vitals:   05/13/17 1711  TempSrc:   PainSc: 2                  [name]

## 2017-05-14 ENCOUNTER — Encounter: Payer: Self-pay | Admitting: Surgery

## 2017-05-15 ENCOUNTER — Telehealth: Payer: Self-pay | Admitting: Surgery

## 2017-05-15 ENCOUNTER — Encounter: Payer: Self-pay | Admitting: Surgery

## 2017-05-15 ENCOUNTER — Ambulatory Visit (INDEPENDENT_AMBULATORY_CARE_PROVIDER_SITE_OTHER): Payer: Medicare Other | Admitting: Surgery

## 2017-05-15 VITALS — BP 152/89 | HR 85 | Temp 98.3°F | Wt 139.0 lb

## 2017-05-15 DIAGNOSIS — Z4889 Encounter for other specified surgical aftercare: Secondary | ICD-10-CM

## 2017-05-15 DIAGNOSIS — K828 Other specified diseases of gallbladder: Secondary | ICD-10-CM

## 2017-05-15 LAB — SURGICAL PATHOLOGY

## 2017-05-15 NOTE — Telephone Encounter (Signed)
Patient thinks she has two incisions that are infected. She is having red streak down her abdomen.  Denies any drainage. Feels hot to touch, fever and chills last night. She does not have thermometer.  Denies nausea or vomiting.  She had a bowel movement today.   Patient added to schedule today for Dr.davis @ 1:45 pm

## 2017-05-15 NOTE — Patient Instructions (Signed)
Please keep your appointment to come back for your post operation appointment.   Please give [place] a call in case you have any questions or concerns.

## 2017-05-15 NOTE — Progress Notes (Signed)
Surgical Clinic Progress/Follow-up Note   HPI:  66 y.o. Female presents to clinic for post-op follow-up evaluation 2 days s/p laparoscopic cholecystectomy for gallbladder mass/polyp. Patient reports her pain has been well-controlled with ibuprofen, and she denies N/V, though she says she awoke yesterday morning (the morning after surgery) with sweating, though she denies fever/chills and does not have a thermometer to check her temperature. After awaking yesterday, she noticed a pink blush below her umbilical port site incision. Today, there is a similar pink blush below her epigastric incision. She states neither incision is particularly painful, and she has been eating and drinking without loose BM's or any difficulty. She otherwise denies N/V, CP, or SOB.  Review of Systems:  Constitutional: denies any other weight loss, fever, chills, or sweats  Eyes: denies any other vision changes, history of eye injury  ENT: denies sore throat, hearing problems  Respiratory: denies shortness of breath, wheezing  Cardiovascular: denies chest pain, palpitations  Gastrointestinal: abdominal pain, N/V, and bowel function as per HPI Musculoskeletal: denies any other joint pains or cramps  Skin: Denies any other rashes or skin discolorations  Neurological: denies any other headache, dizziness, weakness  Psychiatric: denies any other depression, anxiety  All other review of systems: otherwise negative   Vital Signs:  BP (!) 152/89   Pulse 85   Temp 98.3 F (36.8 C) (Oral)   Wt 139 lb (63 kg)   BMI 25.42 kg/m    Physical Exam:  Constitutional:  -- Normal body habitus  -- Awake, alert, and oriented x3  Eyes:  -- Pupils equally round and reactive to light  -- No scleral icterus  Ear, nose, throat:  -- No jugular venous distension  -- No nasal drainage, bleeding Pulmonary:  -- No crackles -- Equal breath sounds bilaterally -- Breathing non-labored at rest Cardiovascular:  -- S1, S2 present   -- No pericardial rubs  Gastrointestinal:  -- Soft, nontender, non-distended, no guarding/rebound  -- Incisions well-approximated without any focal erythema or drainage, though there appears to be a faint pink blush inferior to both patient's umbilical and epigastric incisions with mild-moderate peri-incisional ecchymosis at RUQ > epigastric incisions and possibly even fainter blush at lateral RUQ port site post-surgical incisional wound -- No abdominal masses appreciated, pulsatile or otherwise  Musculoskeletal / Integumentary:  -- Wounds or skin discoloration: None appreciated except as described above in detail (GI)  -- Extremities: B/L UE and LE FROM, hands and feet warm, no edema  Neurologic:  -- Motor function: intact and symmetric  -- Sensation: intact and symmetric   Laboratory studies:  CBC:  Lab Results  Component Value Date   WBC 3.7 03/06/2017   RBC 4.50 03/06/2017   BMP:  Lab Results  Component Value Date   GLUCOSE 143 (H) 03/06/2017   CO2 30 03/06/2017   BUN 17 03/06/2017   CREATININE 1.00 05/05/2017   CALCIUM 9.5 03/06/2017     Assessment:  66 y.o. yo Female with a problem list including...  Patient Active Problem List   Diagnosis Date Noted  . Gallbladder mass   . Anxiety, generalized 03/24/2017  . Hypothyroidism, unspecified 03/24/2017  . Irritable bowel syndrome with diarrhea 03/24/2017  . Other insomnia 03/24/2017  . 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 Gastroesophageal reflux disease without esophagitis 03/20/2017  . Hyperlipidemia, mixed 03/20/2017    presents to clinic for post-op follow-up evaluation, doing overall well just 2 days s/p laparoscopic cholecystectomy for gallbladder mass/polyp with what appears to be faint infra-incisional blush that  seems more likely to be reactive rather than infection considering its presence at multiple sites without increased peri-incisional pain and its asymmetric distribution (entirely inferior to incisions) and being  less intense at the incision itself.  Plan:   - gradually normalize regular diet  - pain control as needed (minimize narcotics)   - keep scheduled post-surgical follow-up appointment  - monitor incisions for worsening pain and/or erythema and/or drainage  - minimize heavy lifting >15 - 20 lbs and strenuous activity until follow-up  - instructed to call office if any questions or concerns  All of the above recommendations were discussed with the patient, and all of patient's questions were answered to her expressed satisfaction.  -- Marilynne Drivers Rosana Hoes, MD, [place]: [facility] Partnering for exceptional care. Office: [phone]

## 2017-05-15 NOTE — Telephone Encounter (Signed)
Patient called and left a message at 8:03 this morning, patient said she just had surgery on Tuesday, patient said she thinks her belly button is infected due to her having a red streak going down her stomach. Please call patient and advise.

## 2017-05-17 ENCOUNTER — Encounter: Payer: Self-pay | Admitting: Surgery

## 2017-05-28 ENCOUNTER — Other Ambulatory Visit: Payer: Self-pay

## 2017-06-02 ENCOUNTER — Encounter: Payer: Self-pay | Admitting: Surgery

## 2017-06-02 ENCOUNTER — Ambulatory Visit (INDEPENDENT_AMBULATORY_CARE_PROVIDER_SITE_OTHER): Payer: Medicare Other | Admitting: Surgery

## 2017-06-02 VITALS — BP 141/90 | HR 67 | Temp 97.8°F | Wt 138.0 lb

## 2017-06-02 DIAGNOSIS — K219 Gastro-esophageal reflux disease without esophagitis: Secondary | ICD-10-CM

## 2017-06-02 DIAGNOSIS — K449 Diaphragmatic hernia without obstruction or gangrene: Secondary | ICD-10-CM

## 2017-06-02 DIAGNOSIS — Z09 Encounter for follow-up examination after completed treatment for conditions other than malignant neoplasm: Secondary | ICD-10-CM

## 2017-06-02 NOTE — Patient Instructions (Signed)
Please go to the [facility] to pick up your CT contrast. They will give you instructions on how to take the contrast.  Please have your images done and then we will follow up with you.

## 2017-06-02 NOTE — Progress Notes (Signed)
06/02/2017  HPI: Patient is status post laparoscopic cholecystectomy on 05/13/17 for suspected gallbladder mass noted on ultrasound.  However pathology showed no gallbladder mass.  Possibly this could have been a fold within the gallbladder wall that appeared to be a mass on ultrasound or a stone that was fixed in place instead.Marland Kitchen  However this could never be confirmed on MRI given that the patient had a lot of motion artifact during the study.  Postoperatively the patient reports that she has been doing well.  She initially had some episodes of bloatedness but that has improved since.  She denies having any significant pain.  She did have some ecchymosis around the incisions but this has been improving.  She is tolerating a normal diet and having normal bowel movements as well.  Vital signs: BP (!) 141/90   Pulse 67   Temp 97.8 F (36.6 C) (Oral)   Wt 62.6 kg (138 lb)   BMI 25.24 kg/m    Physical Exam: Constitutional: No acute distress Abdomen: Soft, nondistended, nontender to palpation.  Incisions are healing well and are clean dry and intact with no evidence of infection.  Ecchymosis is improving.  Assessment/Plan: 67 year old female status post laparoscopic cholecystectomy.  -Pathology reviewed with the patient.  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 -Patient still has 1 more week of no heavy lifting and can resume her usual activities after that. - Patient was found to have a large hiatal hernia on her MRI study.  At this point will order a CT scan of the chest abdomen pelvis as well as a barium swallow and she will follow-up with Dr. Adonis Huguenin for further evaluation and possible surgery.   Melvyn Neth, [place]

## 2017-06-10 ENCOUNTER — Ambulatory Visit
Admission: RE | Admit: 2017-06-10 | Discharge: 2017-06-10 | Disposition: A | Discharge status: Home / Self Care | Payer: Medicare Other | Source: Ambulatory Visit | Attending: General Surgery | Admitting: General Surgery

## 2017-06-10 DIAGNOSIS — E042 Nontoxic multinodular goiter: Secondary | ICD-10-CM | POA: Diagnosis not present

## 2017-06-10 DIAGNOSIS — K449 Diaphragmatic hernia without obstruction or gangrene: Secondary | ICD-10-CM | POA: Insufficient documentation

## 2017-06-10 DIAGNOSIS — K219 Gastro-esophageal reflux disease without esophagitis: Secondary | ICD-10-CM | POA: Diagnosis not present

## 2017-06-10 DIAGNOSIS — R911 Solitary pulmonary nodule: Secondary | ICD-10-CM | POA: Diagnosis not present

## 2017-06-10 MED ORDER — IOPAMIDOL (ISOVUE-300) INJECTION 61%
100.0000 mL | Freq: Once | INTRAVENOUS | Status: AC | PRN
  Administered 2017-06-10: 100 mL via INTRAVENOUS

## 2017-06-11 ENCOUNTER — Ambulatory Visit
Admission: RE | Admit: 2017-06-11 | Discharge: 2017-06-11 | Disposition: A | Discharge status: Home / Self Care | Payer: Medicare Other | Source: Ambulatory Visit | Attending: General Surgery | Admitting: General Surgery

## 2017-06-11 DIAGNOSIS — K449 Diaphragmatic hernia without obstruction or gangrene: Secondary | ICD-10-CM | POA: Diagnosis not present

## 2017-06-11 DIAGNOSIS — K219 Gastro-esophageal reflux disease without esophagitis: Secondary | ICD-10-CM | POA: Diagnosis not present

## 2017-06-13 ENCOUNTER — Ambulatory Visit (INDEPENDENT_AMBULATORY_CARE_PROVIDER_SITE_OTHER): Payer: Medicare Other | Admitting: General Surgery

## 2017-06-13 ENCOUNTER — Other Ambulatory Visit: Payer: Self-pay

## 2017-06-13 ENCOUNTER — Encounter: Payer: Self-pay | Admitting: General Surgery

## 2017-06-13 VITALS — BP 114/67 | HR 75 | Temp 97.8°F | Ht 62.0 in | Wt 137.2 lb

## 2017-06-13 DIAGNOSIS — K449 Diaphragmatic hernia without obstruction or gangrene: Secondary | ICD-10-CM | POA: Insufficient documentation

## 2017-06-13 MED ORDER — PANTOPRAZOLE SODIUM 40 MG PO TBEC: 40.0000 mg | DELAYED_RELEASE_TABLET | Freq: Two times a day (BID) | ORAL | 6 refills | Status: AC

## 2017-06-13 NOTE — Patient Instructions (Addendum)
We have placed a referral today to [facility] in regards to your Hiatal Hernia. We will call you with an appointment as soon as it has been made. This process typically takes 7-10 business days. If you do not hear from their office after that time period, please give our office a call so that we may check on the status for you.

## 2017-06-13 NOTE — Progress Notes (Signed)
Outpatient Surgical Follow Up  06/13/2017  Lacey Ramsey is an 67 y.o. female.   Chief Complaint  Patient presents with  . Follow-up     Follow Up: Hiatal Hernia (go over CT Scans and Swallow Study results)    HPI: 67 year old female returns to clinic for follow-up from her workup of her large hiatal hernia seen on MRCP.  Patient reports she has occasional belching and occasional reflux disease but no chest pain or dysphagia.  She reports she can eat whatever she wants without any difficulty.  She normally avoids carbonated beverages but when she does she has excessive belching.  She denies any fevers, chills, nausea, vomiting, chest pain, shortness of breath, diarrhea, constipation.  Past Medical History:  Diagnosis Date  . Anxiety   . GERD (gastroesophageal reflux disease)   . Hypothyroidism   . IBS (irritable bowel syndrome)   . Insomnia     Past Surgical History:  Procedure Laterality Date  . APPENDECTOMY    . CHOLECYSTECTOMY N/A 05/13/2017   Procedure: LAPAROSCOPIC CHOLECYSTECTOMY;  Surgeon: Olean Ree, MD;  Location: [facility] ORS;  Service: General;  Laterality: N/A;  . KNEE ARTHROSCOPY    . TONSILLECTOMY    . TUBAL LIGATION      Family History  Problem Relation Age of Onset  . Cancer Mother   . Cancer Sister     Social History:  reports that  has never smoked. she has never used smokeless tobacco. She reports that she drinks alcohol. She reports that she does not use drugs.  Allergies:  Allergies  Allergen Reactions  . Codeine Nausea And Vomiting    Medications reviewed.    ROS A multipoint review of systems was completed, all pertinent positives and negatives were documented within the HPI and the remainder are negative   BP 114/67   Pulse 75   Temp 97.8 F (36.6 C) (Oral)   Ht 5\' 2"  (1.575 m)   Wt 62.2 kg (137 lb 3.2 oz)   BMI 25.09 kg/m   Physical Exam General: No acute distress Neck: Supple nontender Chest: Clear to auscultation Heart:  Regular rate and rhythm Abdomen: Soft, nontender, nondistended.  Well-healed laparoscopic incision sites from her laparoscopic cholecystectomy.    No results found for this or any previous visit (from the past 48 hour(s)). No results found.  Assessment/Plan:  1. Paraesophageal hiatal hernia 67 year old female with imaging evidence of a large paraesophageal hiatal hernia with organoaxial rotation of her stomach within her chest.  Had a long conversation with the patient and her husband about the surgical approaches for repair of such a large hernia.  Discussed the risks, benefits, alternatives.  After this conversation they stated that they do want to proceed.  I counseled them that it would be okay for them to seek expert opinion at a local university due to the complex nature of the surgery and potential complications.  With this information they stated that they would prefer an evaluation at [facility] to make sure they are doing the right thing.  Discussed we will provide them with a referral on an outpatient basis.  Counseled as to the signs and symptoms of gastric volvulus with obstruction and to report to clinic or the emergency room immediately should they occur.  Otherwise they will follow-up in clinic on an as-needed basis.     Clayburn Pert, MD FACS General Surgeon  06/13/2017,11:25 AM

## 2017-06-17 ENCOUNTER — Telehealth: Payer: Self-pay | Admitting: General Surgery

## 2017-06-17 NOTE — Telephone Encounter (Signed)
Referral has been sent to [facility] for surgical opinion of Hiatal Hernia repair. All clinic notes,images,pathology, insurance and demographics have been faxed to [phone]. The clinic notes will be reviewed and patient should be called by [facility] with in 5 business days.   [facility] phone #: [phone].  Once appointment is made I will make an outgoing referral in Epic for documentation and advise you of appointment when made.

## 2017-06-20 ENCOUNTER — Telehealth: Payer: Self-pay | Admitting: General Practice

## 2017-06-20 NOTE — Telephone Encounter (Signed)
error 

## 2017-06-23 NOTE — Telephone Encounter (Signed)
Appointment with [facility] has been made.   06/30/17 @ 10:20am with Dr Saint Lucia.   Patient is aware of appointment.

## 2017-06-30 DIAGNOSIS — K219 Gastro-esophageal reflux disease without esophagitis: Secondary | ICD-10-CM | POA: Diagnosis not present

## 2017-06-30 DIAGNOSIS — K449 Diaphragmatic hernia without obstruction or gangrene: Secondary | ICD-10-CM | POA: Diagnosis not present

## 2017-07-15 DIAGNOSIS — K219 Gastro-esophageal reflux disease without esophagitis: Secondary | ICD-10-CM | POA: Diagnosis not present

## 2017-07-15 DIAGNOSIS — Z79899 Other long term (current) drug therapy: Secondary | ICD-10-CM | POA: Diagnosis not present

## 2017-07-15 DIAGNOSIS — Z885 Allergy status to narcotic agent status: Secondary | ICD-10-CM | POA: Diagnosis not present

## 2017-07-15 DIAGNOSIS — K449 Diaphragmatic hernia without obstruction or gangrene: Secondary | ICD-10-CM | POA: Diagnosis not present

## 2017-07-15 DIAGNOSIS — E039 Hypothyroidism, unspecified: Secondary | ICD-10-CM | POA: Diagnosis not present

## 2017-07-29 DIAGNOSIS — K589 Irritable bowel syndrome without diarrhea: Secondary | ICD-10-CM | POA: Diagnosis not present

## 2017-07-29 DIAGNOSIS — K440 Diaphragmatic hernia with obstruction, without gangrene: Secondary | ICD-10-CM | POA: Diagnosis not present

## 2017-07-29 DIAGNOSIS — I493 Ventricular premature depolarization: Secondary | ICD-10-CM | POA: Diagnosis not present

## 2017-07-29 DIAGNOSIS — K449 Diaphragmatic hernia without obstruction or gangrene: Secondary | ICD-10-CM | POA: Diagnosis not present

## 2017-07-29 DIAGNOSIS — K219 Gastro-esophageal reflux disease without esophagitis: Secondary | ICD-10-CM | POA: Diagnosis not present

## 2017-07-29 DIAGNOSIS — E039 Hypothyroidism, unspecified: Secondary | ICD-10-CM | POA: Diagnosis not present

## 2017-07-29 DIAGNOSIS — Z79899 Other long term (current) drug therapy: Secondary | ICD-10-CM | POA: Diagnosis not present

## 2017-07-29 DIAGNOSIS — Z803 Family history of malignant neoplasm of breast: Secondary | ICD-10-CM | POA: Diagnosis not present

## 2017-07-29 DIAGNOSIS — G4709 Other insomnia: Secondary | ICD-10-CM | POA: Diagnosis not present

## 2017-07-29 DIAGNOSIS — E782 Mixed hyperlipidemia: Secondary | ICD-10-CM | POA: Diagnosis not present

## 2017-07-29 DIAGNOSIS — F329 Major depressive disorder, single episode, unspecified: Secondary | ICD-10-CM | POA: Diagnosis not present

## 2017-07-29 DIAGNOSIS — F411 Generalized anxiety disorder: Secondary | ICD-10-CM | POA: Diagnosis not present

## 2017-07-30 DIAGNOSIS — E782 Mixed hyperlipidemia: Secondary | ICD-10-CM | POA: Diagnosis not present

## 2017-07-30 DIAGNOSIS — F411 Generalized anxiety disorder: Secondary | ICD-10-CM | POA: Diagnosis not present

## 2017-07-30 DIAGNOSIS — I493 Ventricular premature depolarization: Secondary | ICD-10-CM | POA: Diagnosis not present

## 2017-07-30 DIAGNOSIS — E039 Hypothyroidism, unspecified: Secondary | ICD-10-CM | POA: Diagnosis not present

## 2017-07-30 DIAGNOSIS — F329 Major depressive disorder, single episode, unspecified: Secondary | ICD-10-CM | POA: Diagnosis not present

## 2017-07-30 DIAGNOSIS — K440 Diaphragmatic hernia with obstruction, without gangrene: Secondary | ICD-10-CM | POA: Diagnosis not present

## 2017-08-18 DIAGNOSIS — R21 Rash and other nonspecific skin eruption: Secondary | ICD-10-CM | POA: Diagnosis not present

## 2017-08-18 DIAGNOSIS — Z09 Encounter for follow-up examination after completed treatment for conditions other than malignant neoplasm: Secondary | ICD-10-CM | POA: Diagnosis not present

## 2017-08-18 DIAGNOSIS — Z8719 Personal history of other diseases of the digestive system: Secondary | ICD-10-CM | POA: Diagnosis not present

## 2017-08-18 DIAGNOSIS — L299 Pruritus, unspecified: Secondary | ICD-10-CM | POA: Diagnosis not present

## 2017-09-02 ENCOUNTER — Telehealth: Payer: Self-pay | Admitting: Gastroenterology

## 2017-09-02 NOTE — Telephone Encounter (Signed)
Pt left vm  To schedule apt with DrMarland Kitchen Allen Norris , attempted to call back number is currently out of service

## 2017-09-17 ENCOUNTER — Telehealth: Payer: Self-pay | Admitting: Surgery

## 2017-09-17 NOTE — Telephone Encounter (Signed)
Patient is calling said she is having diarrhea more than usual , patient denies vomiting, and nausea, patient said there is also some pain, pain level being a five. Patient also denies having any fever or chills. Patient has schedule an appointment. Please call patient and advise.

## 2017-09-17 NOTE — Telephone Encounter (Signed)
Called patient and was not able to leave her a voicemail. However, patient had surgery done at [facility] by Dr. Ranjan Saint Lucia. Therefore, she will need to call him at [phone]. Thank you.

## 2017-09-23 NOTE — Telephone Encounter (Signed)
Spoke with the patient and informed her of this information. Appointment has been cancelled with [place].

## 2017-10-06 DIAGNOSIS — E042 Nontoxic multinodular goiter: Secondary | ICD-10-CM | POA: Diagnosis not present

## 2017-10-06 DIAGNOSIS — K449 Diaphragmatic hernia without obstruction or gangrene: Secondary | ICD-10-CM | POA: Diagnosis not present

## 2017-10-06 DIAGNOSIS — K219 Gastro-esophageal reflux disease without esophagitis: Secondary | ICD-10-CM | POA: Diagnosis not present

## 2017-10-06 DIAGNOSIS — R197 Diarrhea, unspecified: Secondary | ICD-10-CM | POA: Diagnosis not present

## 2017-10-06 DIAGNOSIS — K580 Irritable bowel syndrome with diarrhea: Secondary | ICD-10-CM | POA: Diagnosis not present

## 2017-10-06 DIAGNOSIS — Z9889 Other specified postprocedural states: Secondary | ICD-10-CM | POA: Diagnosis not present

## 2017-10-06 DIAGNOSIS — Z9049 Acquired absence of other specified parts of digestive tract: Secondary | ICD-10-CM | POA: Diagnosis not present

## 2017-10-06 DIAGNOSIS — Z8719 Personal history of other diseases of the digestive system: Secondary | ICD-10-CM | POA: Diagnosis not present

## 2017-10-06 DIAGNOSIS — Z48815 Encounter for surgical aftercare following surgery on the digestive system: Secondary | ICD-10-CM | POA: Diagnosis not present

## 2017-10-07 DIAGNOSIS — R197 Diarrhea, unspecified: Secondary | ICD-10-CM | POA: Diagnosis not present

## 2017-10-08 ENCOUNTER — Ambulatory Visit: Payer: Self-pay | Admitting: Surgery

## 2017-11-14 ENCOUNTER — Other Ambulatory Visit
Admission: RE | Admit: 2017-11-14 | Discharge: 2017-11-14 | Disposition: A | Discharge status: Home / Self Care | Payer: Medicare Other | Source: Ambulatory Visit | Attending: Family | Admitting: Family

## 2017-11-14 ENCOUNTER — Encounter: Payer: Self-pay | Admitting: Emergency Medicine

## 2017-11-14 ENCOUNTER — Other Ambulatory Visit: Payer: Self-pay

## 2017-11-14 ENCOUNTER — Ambulatory Visit
Admission: EM | Admit: 2017-11-14 | Discharge: 2017-11-14 | Disposition: A | Discharge status: Home / Self Care | Payer: Medicare Other | Attending: Emergency Medicine | Admitting: Emergency Medicine

## 2017-11-14 DIAGNOSIS — R109 Unspecified abdominal pain: Secondary | ICD-10-CM | POA: Diagnosis not present

## 2017-11-14 DIAGNOSIS — R197 Diarrhea, unspecified: Secondary | ICD-10-CM | POA: Insufficient documentation

## 2017-11-14 LAB — GASTROINTESTINAL PANEL BY PCR, STOOL (REPLACES STOOL CULTURE)

## 2017-11-14 MED ORDER — CIPROFLOXACIN HCL 500 MG PO TABS: 500.0000 mg | ORAL_TABLET | Freq: Two times a day (BID) | ORAL | 0 refills | Status: AC

## 2017-11-14 MED ORDER — METRONIDAZOLE 500 MG PO TABS: 500.0000 mg | ORAL_TABLET | Freq: Two times a day (BID) | ORAL | 0 refills | Status: AC

## 2017-11-14 NOTE — Discharge Instructions (Addendum)
Recommend collect stool for culture and testing. Then start Cipro 500mg  twice a day as directed. Also start Flagyl 500mg  twice a day. Continue to eat bland foods and increase fluid intake- particularly electrolyte rich drinks. Make appointment with Dr. Allen Norris as soon as possible for follow-up or go to the ER if symptoms do not improve within 48 hours or worsen.

## 2017-11-14 NOTE — ED Provider Notes (Signed)
[facility]    CSN: [id number] Arrival date & time: 11/14/17  0804     History   Chief Complaint Chief Complaint  Patient presents with  . Diarrhea    HPI Lacey Ramsey is a 67 y.o. female.   67 year old female presents with persistent diarrhea on and off for the past 6-7 weeks that has gotten much worse in the past week, especially the past 2 days. Having fecal urgency and watery, brown to yellowish stool and bowel movements every 30 to 60 minutes over the past 24 hours. Also having some nausea but no vomiting. No blood in her stool. Slight central abdominal pain. No urinary or vaginal symptoms. Denies any fever, unusual foods or recent travel. Has tried Immodium and Peptobismul with minimal relief. Has history of IBS with diarrhea but episodes have never been this severe. Also has history of diverticulosis and has taken antibiotics in the past with good success. Has seen various GI specialists as well as Dr. Saint Lucia (Surgeon) at [facility] for esophageal hernia repair in 07/2017 and cholecystectomy at [facility] in 04/2017. Was seen 10/06/2017 at [place] for diarrhea and stool cultures were done and negative. Had significant abdominal gas at the time as well and had resolved so did not pursue additional testing. No recent antibiotic use. Other chronic health issues include thyroid disease, anxiety and insomnia in which she takes Synthroid, Prozac and Trazodone daily. She also take Amitriptyline daily for irritable bowel. She no longer takes Protonix since surgical repair of her hernia in March 2019. She does not smoke or take illicit drugs and currently does not consume alcohol.   The history is provided by the patient.    Past Medical History:  Diagnosis Date  . Anxiety   . GERD (gastroesophageal reflux disease)   . Hypothyroidism   . IBS (irritable bowel syndrome)   . Insomnia     Patient Active Problem List   Diagnosis Date Noted  . 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  Gallbladder mass   . Anxiety, generalized 03/24/2017  . Hypothyroidism, unspecified 03/24/2017  . Irritable bowel syndrome with diarrhea 03/24/2017  . Other insomnia 03/24/2017  . 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 Gastroesophageal reflux disease without esophagitis 03/20/2017  . Hyperlipidemia, mixed 03/20/2017    Past Surgical History:  Procedure Laterality Date  . APPENDECTOMY    . CHOLECYSTECTOMY N/A 05/13/2017   Procedure: LAPAROSCOPIC CHOLECYSTECTOMY;  Surgeon: Olean Ree, MD;  Location: [facility] ORS;  Service: General;  Laterality: N/A;  . HERNIA REPAIR    . KNEE ARTHROSCOPY    . TONSILLECTOMY    . TUBAL LIGATION      OB History   None      Home Medications    Prior to Admission medications   Medication Sig Start Date End Date Taking? Authorizing Provider  amitriptyline (ELAVIL) 25 MG tablet Take 25 mg by mouth at bedtime.   Yes [provider]  levothyroxine (SYNTHROID, LEVOTHROID) 50 MCG tablet Take 50 mcg by mouth daily before breakfast.   Yes [provider]  traZODone (DESYREL) 50 MG tablet Take 50 mg by mouth 2 (two) times daily.   Yes [provider]  ciprofloxacin (CIPRO) 500 MG tablet Take 1 tablet (500 mg total) by mouth 2 (two) times daily for 10 days. 11/14/17 11/24/17  Katy Apo, NP  FLUoxetine (PROZAC) 10 MG tablet Take 10 mg by mouth daily. 10/14/17   [provider]  metroNIDAZOLE (FLAGYL) 500 MG tablet Take 1  tablet (500 mg total) by mouth 2 (two) times daily for 10 days. 11/14/17 11/24/17  Katy Apo, NP  pantoprazole (PROTONIX) 40 MG tablet Take 1 tablet (40 mg total) by mouth 2 (two) times daily before a meal. 06/13/17   Clayburn Pert, MD    Family History Family History  Problem Relation Age of Onset  . Cancer Mother   . Cancer Sister     Social History Social History   Tobacco Use  . Smoking status: Never Smoker  . Smokeless tobacco: Never Used  Substance Use Topics  . Alcohol use: Yes    Comment:  rare   . Drug use: No     Allergies   Codeine   Review of Systems Review of Systems  Constitutional: Positive for fatigue. Negative for activity change, appetite change, chills and fever.  HENT: Negative for congestion, mouth sores, sore throat and trouble swallowing.   Respiratory: Negative for cough, chest tightness, shortness of breath and wheezing.   Gastrointestinal: Positive for abdominal pain, diarrhea and nausea. Negative for blood in stool, constipation and vomiting.  Genitourinary: Negative for decreased urine volume, difficulty urinating, dysuria, flank pain, frequency, hematuria, pelvic pain, urgency and vaginal discharge.  Musculoskeletal: Negative for arthralgias, back pain and myalgias.  Skin: Negative for color change, rash and wound.  Neurological: Negative for dizziness, seizures, syncope, weakness, light-headedness, numbness and headaches.  Hematological: Negative for adenopathy. Does not bruise/bleed easily.  Psychiatric/Behavioral: Positive for sleep disturbance.     Physical Exam Triage Vital Signs ED Triage Vitals  Enc Vitals Group     BP 11/14/17 0819 131/77     Pulse Rate 11/14/17 0819 73     Resp 11/14/17 0819 16     Temp 11/14/17 0819 98.4 F (36.9 C)     Temp Source 11/14/17 0819 Oral     SpO2 11/14/17 0819 100 %     Weight 11/14/17 0815 132 lb 14.4 oz (60.3 kg)     Height 11/14/17 0815 5\' 2"  (1.575 m)     Head Circumference --      Peak Flow --      Pain Score 11/14/17 0815 3     Pain Loc --      Pain Edu? --      Excl. in [place]? --    No data found.  Updated Vital Signs BP 131/77 (BP Location: Left Arm)   Pulse 73   Temp 98.4 F (36.9 C) (Oral)   Resp 16   Ht 5\' 2"  (1.575 m)   Wt 132 lb 14.4 oz (60.3 kg)   SpO2 100%   BMI 24.31 kg/m   Visual Acuity Right Eye Distance:   Left Eye Distance:   Bilateral Distance:    Right Eye Near:   Left Eye Near:    Bilateral Near:     Physical Exam  Constitutional: She is oriented to  person, place, and time. Vital signs are normal. She appears well-developed and well-nourished. She is cooperative. No distress.  Patient sitting in exam chair in no acute distress. But had to run to the bathroom upon standing up due to fecal urgency.   HENT:  Head: Normocephalic and atraumatic.  Mouth/Throat: Oropharynx is clear and moist.  Eyes: Conjunctivae and EOM are normal.  Neck: Normal range of motion. Neck supple.  Cardiovascular: Normal rate, regular rhythm and normal heart sounds.  No murmur heard. Pulmonary/Chest: Effort normal and breath sounds normal. No respiratory distress.  Abdominal: Soft. Normal appearance. She exhibits  no distension, no fluid wave, no abdominal bruit and no mass. Bowel sounds are increased. There is no hepatosplenomegaly. There is generalized tenderness and tenderness in the epigastric area. There is no rigidity, no rebound, no guarding and no CVA tenderness.  Musculoskeletal: Normal range of motion.  Neurological: She is alert and oriented to person, place, and time.  Skin: Skin is warm and dry. Capillary refill takes less than 2 seconds.  Psychiatric: She has a normal mood and affect. Her behavior is normal. Judgment and thought content normal.  Vitals reviewed.    UC Treatments / Results  Labs (all labs ordered are listed, but only abnormal results are displayed) Labs Reviewed - No data to display  EKG None  Radiology No results found.  Procedures Procedures (including critical care time)  Medications Ordered in UC Medications - No data to display  Initial Impression / Assessment and Plan / UC Course  I have reviewed the triage vital signs and the nursing notes.  Pertinent labs & imaging results that were available during my care of the patient were reviewed by me and considered in my medical decision making (see chart for details).    Discussed various possible etiologies for her symptoms. May be a severe flare-up of IBS. May be  Diverticulitis-related or infectious origin. Will send stool for culture. Will trial antibiotics but discussed that she may need additional testing and imaging if symptoms do not improve. May trial Cipro and Flagyl as directed. Continue to eat bland foods and advance diet as tolerated. Recommend follow-up with Dr. Allen Norris or [facility] ASAP (on Monday- 3 days) or go to the ER if symptoms do not improve within 48 hours or worsen.  Final Clinical Impressions(s) / UC Diagnoses   Final diagnoses:  Diarrhea, unspecified type  Central abdominal pain     Discharge Instructions     Recommend collect stool for culture and testing. Then start Cipro 500mg  twice a day as directed. Also start Flagyl 500mg  twice a day. Continue to eat bland foods and increase fluid intake- particularly electrolyte rich drinks. Make appointment with Dr. Allen Norris as soon as possible for follow-up or go to the ER if symptoms do not improve within 48 hours or worsen.     ED Prescriptions    Medication Sig Dispense Auth. Provider   ciprofloxacin (CIPRO) 500 MG tablet Take 1 tablet (500 mg total) by mouth 2 (two) times daily for 10 days. 20 tablet Katy Apo, NP   metroNIDAZOLE (FLAGYL) 500 MG tablet Take 1 tablet (500 mg total) by mouth 2 (two) times daily for 10 days. 20 tablet Katy Apo, NP     Controlled Substance Prescriptions [place] Controlled Substance Registry consulted? Not Applicable   Katy Apo, NP 11/15/17 [phone]

## 2017-11-14 NOTE — ED Triage Notes (Signed)
Patient c/o diarrhea off and on for the past 6 weeks.  Patient states that for the past 2 days her darrhea has become more frequent.  Patient states that she has been taking Imodium and Pepto.

## 2017-11-15 ENCOUNTER — Encounter: Payer: Self-pay | Admitting: Emergency Medicine

## 2017-11-15 ENCOUNTER — Emergency Department: Payer: Medicare Other

## 2017-11-15 ENCOUNTER — Other Ambulatory Visit: Payer: Self-pay

## 2017-11-15 ENCOUNTER — Inpatient Hospital Stay
Admission: EM | Admit: 2017-11-15 | Discharge: 2017-11-18 | DRG: 446 | Disposition: A | Discharge status: Home / Self Care | Payer: Medicare Other | Attending: Family Medicine | Admitting: Family Medicine

## 2017-11-15 DIAGNOSIS — E039 Hypothyroidism, unspecified: Secondary | ICD-10-CM | POA: Diagnosis present

## 2017-11-15 DIAGNOSIS — R748 Abnormal levels of other serum enzymes: Secondary | ICD-10-CM | POA: Diagnosis not present

## 2017-11-15 DIAGNOSIS — K589 Irritable bowel syndrome without diarrhea: Secondary | ICD-10-CM | POA: Diagnosis present

## 2017-11-15 DIAGNOSIS — R945 Abnormal results of liver function studies: Secondary | ICD-10-CM | POA: Diagnosis not present

## 2017-11-15 DIAGNOSIS — K529 Noninfective gastroenteritis and colitis, unspecified: Secondary | ICD-10-CM | POA: Diagnosis present

## 2017-11-15 DIAGNOSIS — Z79899 Other long term (current) drug therapy: Secondary | ICD-10-CM

## 2017-11-15 DIAGNOSIS — K838 Other specified diseases of biliary tract: Secondary | ICD-10-CM

## 2017-11-15 DIAGNOSIS — F419 Anxiety disorder, unspecified: Secondary | ICD-10-CM | POA: Diagnosis present

## 2017-11-15 DIAGNOSIS — R935 Abnormal findings on diagnostic imaging of other abdominal regions, including retroperitoneum: Secondary | ICD-10-CM | POA: Diagnosis not present

## 2017-11-15 DIAGNOSIS — R1010 Upper abdominal pain, unspecified: Secondary | ICD-10-CM | POA: Diagnosis not present

## 2017-11-15 DIAGNOSIS — R7989 Other specified abnormal findings of blood chemistry: Secondary | ICD-10-CM | POA: Diagnosis present

## 2017-11-15 DIAGNOSIS — K219 Gastro-esophageal reflux disease without esophagitis: Secondary | ICD-10-CM | POA: Diagnosis present

## 2017-11-15 DIAGNOSIS — R109 Unspecified abdominal pain: Secondary | ICD-10-CM | POA: Diagnosis not present

## 2017-11-15 DIAGNOSIS — K831 Obstruction of bile duct: Secondary | ICD-10-CM | POA: Diagnosis not present

## 2017-11-15 DIAGNOSIS — R197 Diarrhea, unspecified: Secondary | ICD-10-CM | POA: Diagnosis not present

## 2017-11-15 DIAGNOSIS — F411 Generalized anxiety disorder: Secondary | ICD-10-CM | POA: Diagnosis not present

## 2017-11-15 DIAGNOSIS — Z885 Allergy status to narcotic agent status: Secondary | ICD-10-CM | POA: Diagnosis not present

## 2017-11-15 DIAGNOSIS — E782 Mixed hyperlipidemia: Secondary | ICD-10-CM | POA: Diagnosis not present

## 2017-11-15 DIAGNOSIS — R188 Other ascites: Secondary | ICD-10-CM | POA: Diagnosis not present

## 2017-11-15 DIAGNOSIS — R932 Abnormal findings on diagnostic imaging of liver and biliary tract: Secondary | ICD-10-CM | POA: Diagnosis not present

## 2017-11-15 DIAGNOSIS — K449 Diaphragmatic hernia without obstruction or gangrene: Secondary | ICD-10-CM | POA: Diagnosis present

## 2017-11-15 LAB — COMPREHENSIVE METABOLIC PANEL
ALBUMIN: 4 g/dL (ref 3.5–5.0)
ALT: 160 U/L — ABNORMAL HIGH (ref 14–54)
ANION GAP: 7 (ref 5–15)
AST: 143 U/L — ABNORMAL HIGH (ref 15–41)
Alkaline Phosphatase: 459 U/L — ABNORMAL HIGH (ref 38–126)
BUN: 12 mg/dL (ref 6–20)
CHLORIDE: 105 mmol/L (ref 101–111)
CO2: 29 mmol/L (ref 22–32)
Calcium: 10.2 mg/dL (ref 8.9–10.3)
Creatinine, Ser: 0.83 mg/dL (ref 0.44–1.00)
GFR calc Af Amer: >60 mL/min (ref >60)
GFR calc non Af Amer: >60 mL/min (ref >60)
GLUCOSE: 141 mg/dL — AB (ref 65–99)
POTASSIUM: 4.1 mmol/L (ref 3.5–5.1)
SODIUM: 141 mmol/L (ref 135–145)
Total Bilirubin: 0.4 mg/dL (ref 0.3–1.2)
Total Protein: 7 g/dL (ref 6.5–8.1)

## 2017-11-15 LAB — CBC
HEMATOCRIT: 42.1 % (ref 35.0–47.0)
HEMOGLOBIN: 14.2 g/dL (ref 12.0–16.0)
MCH: 30.8 pg (ref 26.0–34.0)
MCHC: 33.6 g/dL (ref 32.0–36.0)
MCV: 91.5 fL (ref 80.0–100.0)
Platelets: 204 10*3/uL (ref 150–440)
RBC: 4.6 MIL/uL (ref 3.80–5.20)
RDW: 13.8 % (ref 11.5–14.5)
WBC: 6 10*3/uL (ref 3.6–11.0)

## 2017-11-15 LAB — LIPASE, BLOOD: LIPASE: 155 U/L — AB (ref 11–51)

## 2017-11-15 LAB — TROPONIN I: Troponin I: <0.03 ng/mL (ref <0.03)

## 2017-11-15 MED ORDER — TRAZODONE HCL 50 MG PO TABS
50.0000 mg | ORAL_TABLET | Freq: Two times a day (BID) | ORAL | Status: DC
  Administered 2017-11-16 – 2017-11-17 (×3): 50 mg via ORAL
  Filled 2017-11-15 (×4): qty 1

## 2017-11-15 MED ORDER — SODIUM CHLORIDE 0.9 % IV BOLUS
1000.0000 mL | Freq: Once | INTRAVENOUS | Status: AC
  Administered 2017-11-15: 1000 mL via INTRAVENOUS

## 2017-11-15 MED ORDER — ACETAMINOPHEN 650 MG RE SUPP: 650.0000 mg | Freq: Four times a day (QID) | RECTAL | Status: DC | PRN

## 2017-11-15 MED ORDER — ACETAMINOPHEN 325 MG PO TABS: 650.0000 mg | ORAL_TABLET | Freq: Four times a day (QID) | ORAL | Status: DC | PRN

## 2017-11-15 MED ORDER — DOCUSATE SODIUM 100 MG PO CAPS
100.0000 mg | ORAL_CAPSULE | Freq: Two times a day (BID) | ORAL | Status: DC
  Administered 2017-11-16: 100 mg via ORAL
  Filled 2017-11-15 (×3): qty 1

## 2017-11-15 MED ORDER — HYDROCODONE-ACETAMINOPHEN 5-325 MG PO TABS
1.0000 | ORAL_TABLET | ORAL | Status: DC | PRN
  Administered 2017-11-16 (×2): 2 via ORAL
  Filled 2017-11-15 (×2): qty 2

## 2017-11-15 MED ORDER — LEVOTHYROXINE SODIUM 50 MCG PO TABS
50.0000 ug | ORAL_TABLET | Freq: Every day | ORAL | Status: DC
  Administered 2017-11-18: 50 ug via ORAL
  Filled 2017-11-15: qty 1

## 2017-11-15 MED ORDER — AMITRIPTYLINE HCL 25 MG PO TABS
25.0000 mg | ORAL_TABLET | Freq: Every day | ORAL | Status: DC
  Administered 2017-11-16: 25 mg via ORAL
  Filled 2017-11-15 (×2): qty 1

## 2017-11-15 MED ORDER — ONDANSETRON HCL 4 MG/2ML IJ SOLN
4.0000 mg | Freq: Four times a day (QID) | INTRAMUSCULAR | Status: DC | PRN
  Administered 2017-11-17 (×2): 4 mg via INTRAVENOUS
  Filled 2017-11-15 (×2): qty 2

## 2017-11-15 MED ORDER — ONDANSETRON HCL 4 MG PO TABS: 4.0000 mg | ORAL_TABLET | Freq: Four times a day (QID) | ORAL | Status: DC | PRN

## 2017-11-15 MED ORDER — SODIUM CHLORIDE 0.9 % IV SOLN
INTRAVENOUS | Status: DC
  Administered 2017-11-16 – 2017-11-18 (×4): via INTRAVENOUS

## 2017-11-15 MED ORDER — TRAZODONE HCL 50 MG PO TABS: 25.0000 mg | ORAL_TABLET | Freq: Every evening | ORAL | Status: DC | PRN

## 2017-11-15 MED ORDER — HEPARIN SODIUM (PORCINE) 5000 UNIT/ML IJ SOLN
5000.0000 [IU] | Freq: Three times a day (TID) | INTRAMUSCULAR | Status: DC
  Administered 2017-11-16 – 2017-11-18 (×7): 5000 [IU] via SUBCUTANEOUS
  Filled 2017-11-15 (×7): qty 1

## 2017-11-15 MED ORDER — BISACODYL 5 MG PO TBEC
5.0000 mg | DELAYED_RELEASE_TABLET | Freq: Every day | ORAL | Status: DC | PRN
Start: 2017-11-15 — End: 2017-11-18

## 2017-11-15 MED ORDER — ONDANSETRON HCL 4 MG/2ML IJ SOLN
4.0000 mg | Freq: Once | INTRAMUSCULAR | Status: AC
  Administered 2017-11-15: 4 mg via INTRAVENOUS
  Filled 2017-11-15: qty 2

## 2017-11-15 MED ORDER — PANTOPRAZOLE SODIUM 40 MG PO TBEC
40.0000 mg | DELAYED_RELEASE_TABLET | Freq: Two times a day (BID) | ORAL | Status: DC
  Administered 2017-11-16 – 2017-11-18 (×2): 40 mg via ORAL
  Filled 2017-11-15 (×2): qty 1

## 2017-11-15 MED ORDER — MORPHINE SULFATE (PF) 4 MG/ML IV SOLN
4.0000 mg | Freq: Once | INTRAVENOUS | Status: AC
  Administered 2017-11-15: 4 mg via INTRAVENOUS
  Filled 2017-11-15: qty 1

## 2017-11-15 MED ORDER — FLUOXETINE HCL 10 MG PO CAPS
10.0000 mg | ORAL_CAPSULE | Freq: Every day | ORAL | Status: DC
  Administered 2017-11-16 – 2017-11-17 (×2): 10 mg via ORAL
  Filled 2017-11-15 (×4): qty 1

## 2017-11-15 NOTE — ED Notes (Signed)
Spoke with 2c - they need to talk to nursing supervisor before approving

## 2017-11-15 NOTE — ED Notes (Signed)
Report called to [place].

## 2017-11-15 NOTE — ED Provider Notes (Signed)
[facility] Emergency Department Provider Note  Time seen: 8:44 PM  I have reviewed the triage vital signs and the nursing notes.   HISTORY  Chief Complaint Abdominal Pain    HPI Lacey Ramsey is a 67 y.o. female with a past medical history of anxiety, gastric reflux, IBS, presents to the emergency department for abdominal pain.  According to the patient she had her gallbladder out approximately 6 months ago, ever since she has been experiencing intermittent episodes of diarrhea, states the diarrhea has been much worse over the past several days.  Since yesterday she has been experiencing progressively worsening upper abdominal pain.  Went to her doctor yesterday who prescribed her ciprofloxacin and Flagyl for possible diverticulitis.  Patient states the pain has continued, currently moderate dull aching pain across the upper abdomen with some radiation to the back.  Denies any nausea, vomiting, black or bloody stool, dysuria, hematuria or fever.   Past Medical History:  Diagnosis Date  . Anxiety   . GERD (gastroesophageal reflux disease)   . Hypothyroidism   . IBS (irritable bowel syndrome)   . Insomnia     Patient Active Problem List   Diagnosis Date Noted  . 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 Gallbladder mass   . Anxiety, generalized 03/24/2017  . Hypothyroidism, unspecified 03/24/2017  . Irritable bowel syndrome with diarrhea 03/24/2017  . Other insomnia 03/24/2017  . 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 Gastroesophageal reflux disease without esophagitis 03/20/2017  . Hyperlipidemia, mixed 03/20/2017    Past Surgical History:  Procedure Laterality Date  . APPENDECTOMY    . CHOLECYSTECTOMY N/A 05/13/2017   Procedure: LAPAROSCOPIC CHOLECYSTECTOMY;  Surgeon: Olean Ree, MD;  Location: [facility] ORS;  Service: General;  Laterality: N/A;  . HERNIA REPAIR    . KNEE ARTHROSCOPY    . TONSILLECTOMY    . TUBAL LIGATION      Prior to Admission  medications   Medication Sig Start Date End Date Taking? Authorizing Provider  amitriptyline (ELAVIL) 25 MG tablet Take 25 mg by mouth at bedtime.   Yes [provider]  ciprofloxacin (CIPRO) 500 MG tablet Take 1 tablet (500 mg total) by mouth 2 (two) times daily for 10 days. 11/14/17 11/24/17 Yes Amyot, Nicholes Stairs, NP  FLUoxetine (PROZAC) 10 MG tablet Take 10 mg by mouth daily. 10/14/17  Yes [provider]  levothyroxine (SYNTHROID, LEVOTHROID) 50 MCG tablet Take 50 mcg by mouth daily before breakfast.   Yes [provider]  metroNIDAZOLE (FLAGYL) 500 MG tablet Take 1 tablet (500 mg total) by mouth 2 (two) times daily for 10 days. 11/14/17 11/24/17 Yes Amyot, Nicholes Stairs, NP  pantoprazole (PROTONIX) 40 MG tablet Take 1 tablet (40 mg total) by mouth 2 (two) times daily before a meal. 06/13/17  Yes Clayburn Pert, MD  traZODone (DESYREL) 50 MG tablet Take 50 mg by mouth 2 (two) times daily.   Yes [provider]    Allergies  Allergen Reactions  . Codeine Nausea And Vomiting    Family History  Problem Relation Age of Onset  . Cancer Mother   . Cancer Sister     Social History Social History   Tobacco Use  . Smoking status: Never Smoker  . Smokeless tobacco: Never Used  Substance Use Topics  . Alcohol use: Yes    Comment: rare   . Drug use: No    Review of Systems Constitutional: Negative for fever. Cardiovascular: Negative for chest pain. Respiratory: Negative for shortness of breath.  Gastrointestinal: Moderate upper abdominal pain with radiation to the back, negative for nausea vomiting or diarrhea. Genitourinary: Negative for urinary compaints Musculoskeletal: Negative for musculoskeletal complaints Skin: Negative for skin complaints  Neurological: Negative for headache All other ROS negative  ____________________________________________   PHYSICAL EXAM:  VITAL SIGNS: ED Triage Vitals  Enc Vitals Group     BP 11/15/17 1955 130/80      Pulse Rate 11/15/17 1955 78     Resp 11/15/17 1955 18     Temp 11/15/17 1955 98.3 F (36.8 C)     Temp Source 11/15/17 1955 Oral     SpO2 11/15/17 1955 100 %     Weight 11/15/17 2003 132 lb (59.9 kg)     Height 11/15/17 2003 5\' 2"  (1.575 m)     Head Circumference --      Peak Flow --      Pain Score 11/15/17 2002 10     Pain Loc --      Pain Edu? --      Excl. in [place]? --     Constitutional: Alert and oriented. Well appearing and in no distress. Eyes: Normal exam ENT   Head: Normocephalic and atraumatic.   Mouth/Throat: Mucous membranes are moist. Cardiovascular: Normal rate, regular rhythm. No murmur Respiratory: Normal respiratory effort without tachypnea nor retractions. Breath sounds are clear Gastrointestinal: Soft, moderate epigastric tenderness to palpation, no rebound or guarding, no distention. Musculoskeletal: Nontender with normal range of motion in all extremities. Neurologic:  Normal speech and language. No gross focal neurologic deficits Skin:  Skin is warm, dry and intact.  Psychiatric: Mood and affect are normal.   ____________________________________________    EKG  EKG reviewed and interpreted by myself shows normal sinus rhythm 84 bpm with a narrow QRS, normal axis, normal intervals, nonspecific but no concerning ST changes.  ____________________________________________    RADIOLOGY  Ultrasound shows a significantly dilated common bile duct.  ____________________________________________   INITIAL IMPRESSION / ASSESSMENT AND PLAN / ED COURSE  Pertinent labs & imaging results that were available during my care of the patient were reviewed by me and considered in my medical decision making (see chart for details).  Patient presents to the emergency department for upper abdominal discomfort since yesterday along with worsening diarrhea over the past several days.  Differential would include pancreatitis, hepatitis, colitis or diverticulitis,  gastritis, gastric ulcers or peptic ulcers, retained stone or choledocholithiasis.  Patient's labs are concerning for an elevated lipase, elevated LFTs as well as alkaline phosphatase.  Patient denies any alcohol use over the past 6 months.  Differential would include retained stone, pancreatitis with localized inflammatory response, hepatitis.  Will obtain right upper quadrant ultrasound to further evaluate.  We will treat pain, nausea, IV hydrate while awaiting results.  Ultrasound concerning for possible distal common bile duct stone versus obstruction we will discuss with GI medicine for further recommendations.    GI medicine recommends admission for MRCP and possible ERCP on Monday if needed.  Patient has a normal white blood cell count, afebrile I believe this is an appropriate plan of care.  GI will see in the morning.   ____________________________________________   FINAL CLINICAL IMPRESSION(S) / ED DIAGNOSES  Upper abdominal pain Common bile duct obstruction    Harvest Dark, MD 11/15/17 2157

## 2017-11-15 NOTE — ED Triage Notes (Addendum)
Pt presents tonight with c/o epigastric pain, intermittently for months, but worse today; pt says she had her gallbladder out back in December of last year and has had a hernia repair since then; pt says she's had intermittent diarrhea since; was seen at [facility] urgent care yesterday for same; stool culture collected but no bloodwork performed; here tonight for the abd pain-pt says it feels like there is a "fist in the pit of my stomach" and radiates through to her back; pt in no acute distress; awake and alert, talking in complete coherent sentences;

## 2017-11-16 ENCOUNTER — Inpatient Hospital Stay: Payer: Medicare Other

## 2017-11-16 ENCOUNTER — Other Ambulatory Visit: Payer: Self-pay

## 2017-11-16 DIAGNOSIS — R945 Abnormal results of liver function studies: Secondary | ICD-10-CM

## 2017-11-16 LAB — BASIC METABOLIC PANEL
Anion gap: 4 — ABNORMAL LOW (ref 5–15)
BUN: 13 mg/dL (ref 6–20)
CALCIUM: 8.6 mg/dL — AB (ref 8.9–10.3)
CHLORIDE: 112 mmol/L — AB (ref 101–111)
CO2: 23 mmol/L (ref 22–32)
CREATININE: 0.56 mg/dL (ref 0.44–1.00)
GFR calc Af Amer: >60 mL/min (ref >60)
GFR calc non Af Amer: >60 mL/min (ref >60)
Glucose, Bld: 92 mg/dL (ref 65–99)
Potassium: 3.8 mmol/L (ref 3.5–5.1)
Sodium: 139 mmol/L (ref 135–145)

## 2017-11-16 LAB — CBC
HCT: 36.9 % (ref 35.0–47.0)
Hemoglobin: 12.4 g/dL (ref 12.0–16.0)
MCH: 31.2 pg (ref 26.0–34.0)
MCHC: 33.6 g/dL (ref 32.0–36.0)
MCV: 92.9 fL (ref 80.0–100.0)
PLATELETS: 172 10*3/uL (ref 150–440)
RBC: 3.98 MIL/uL (ref 3.80–5.20)
RDW: 13.6 % (ref 11.5–14.5)
WBC: 5.3 10*3/uL (ref 3.6–11.0)

## 2017-11-16 LAB — LIPASE, BLOOD: Lipase: 64 U/L — ABNORMAL HIGH (ref 11–51)

## 2017-11-16 LAB — HEPATIC FUNCTION PANEL
ALT: 104 U/L — ABNORMAL HIGH (ref 14–54)
AST: 63 U/L — ABNORMAL HIGH (ref 15–41)
Albumin: 3.1 g/dL — ABNORMAL LOW (ref 3.5–5.0)
Alkaline Phosphatase: 345 U/L — ABNORMAL HIGH (ref 38–126)
BILIRUBIN DIRECT: 0.2 mg/dL (ref 0.1–0.5)
BILIRUBIN INDIRECT: 0.1 mg/dL — AB (ref 0.3–0.9)
BILIRUBIN TOTAL: 0.3 mg/dL (ref 0.3–1.2)
Total Protein: 5.3 g/dL — ABNORMAL LOW (ref 6.5–8.1)

## 2017-11-16 LAB — LACTIC ACID, PLASMA
LACTIC ACID, VENOUS: 0.4 mmol/L — AB (ref 0.5–1.9)
LACTIC ACID, VENOUS: 1.2 mmol/L (ref 0.5–1.9)

## 2017-11-16 LAB — C DIFFICILE QUICK SCREEN W PCR REFLEX
C DIFFICILE (CDIFF) TOXIN: NEGATIVE
C Diff antigen: NEGATIVE
C Diff interpretation: NOT DETECTED

## 2017-11-16 LAB — GLUCOSE, CAPILLARY: GLUCOSE-CAPILLARY: 70 mg/dL (ref 65–99)

## 2017-11-16 LAB — SEDIMENTATION RATE: Sed Rate: 8 mm/hr (ref 0–30)

## 2017-11-16 MED ORDER — GADOBENATE DIMEGLUMINE 529 MG/ML IV SOLN
12.0000 mL | Freq: Once | INTRAVENOUS | Status: AC | PRN
  Administered 2017-11-16: 12 mL via INTRAVENOUS

## 2017-11-16 MED ORDER — AMITRIPTYLINE HCL 25 MG PO TABS
25.0000 mg | ORAL_TABLET | Freq: Every day | ORAL | Status: DC
  Filled 2017-11-16 (×2): qty 1

## 2017-11-16 MED ORDER — SODIUM CHLORIDE 0.9 % IV BOLUS
1000.0000 mL | Freq: Once | INTRAVENOUS | Status: AC
  Administered 2017-11-16: 1000 mL via INTRAVENOUS

## 2017-11-16 MED ORDER — METRONIDAZOLE 500 MG PO TABS
500.0000 mg | ORAL_TABLET | Freq: Two times a day (BID) | ORAL | Status: DC
  Administered 2017-11-16 – 2017-11-18 (×4): 500 mg via ORAL
  Filled 2017-11-16 (×6): qty 1

## 2017-11-16 MED ORDER — CIPROFLOXACIN HCL 500 MG PO TABS
500.0000 mg | ORAL_TABLET | Freq: Two times a day (BID) | ORAL | Status: DC
  Administered 2017-11-16 – 2017-11-18 (×4): 500 mg via ORAL
  Filled 2017-11-16 (×5): qty 1

## 2017-11-16 NOTE — Progress Notes (Signed)
[place] at [place] NAME: Lacey Ramsey    MR#:  [id number]  DATE OF BIRTH:  15-Apr-1951  SUBJECTIVE:  CHIEF COMPLAINT:   Chief Complaint  Patient presents with  . Abdominal Pain  Patient currently without complaint, denies pain, case discussed with gastroenterology, for MRCP for further evaluation  REVIEW OF SYSTEMS:  CONSTITUTIONAL: No fever, fatigue or weakness.  EYES: No blurred or double vision.  EARS, NOSE, AND THROAT: No tinnitus or ear pain.  RESPIRATORY: No cough, shortness of breath, wheezing or hemoptysis.  CARDIOVASCULAR: No chest pain, orthopnea, edema.  GASTROINTESTINAL: No nausea, vomiting, diarrhea or abdominal pain.  GENITOURINARY: No dysuria, hematuria.  ENDOCRINE: No polyuria, nocturia,  HEMATOLOGY: No anemia, easy bruising or bleeding SKIN: No rash or lesion. MUSCULOSKELETAL: No joint pain or arthritis.   NEUROLOGIC: No tingling, numbness, weakness.  PSYCHIATRY: No anxiety or depression.   ROS  DRUG ALLERGIES:   Allergies  Allergen Reactions  . Codeine Nausea And Vomiting    VITALS:  Blood pressure (!) 97/53, pulse 65, temperature 97.7 F (36.5 C), temperature source Oral, resp. rate 18, height 5\' 2"  (1.575 m), weight 61.1 kg (134 lb 11.2 oz), SpO2 99 %.  PHYSICAL EXAMINATION:  GENERAL:  67 y.o.-year-old patient lying in the bed with no acute distress.  EYES: Pupils equal, round, reactive to light and accommodation. No scleral icterus. Extraocular muscles intact.  HEENT: Head atraumatic, normocephalic. Oropharynx and nasopharynx clear.  NECK:  Supple, no jugular venous distention. No thyroid enlargement, no tenderness.  LUNGS: Normal breath sounds bilaterally, no wheezing, rales,rhonchi or crepitation. No use of accessory muscles of respiration.  CARDIOVASCULAR: S1, S2 normal. No murmurs, rubs, or gallops.  ABDOMEN: Soft, nontender, nondistended. Bowel sounds present. No organomegaly or mass.   EXTREMITIES: No pedal edema, cyanosis, or clubbing.  NEUROLOGIC: Cranial nerves II through XII are intact. Muscle strength 5/5 in all extremities. Sensation intact. Gait not checked.  PSYCHIATRIC: The patient is alert and oriented x 3.  SKIN: No obvious rash, lesion, or ulcer.   Physical Exam LABORATORY PANEL:   CBC Recent Labs  Lab 11/16/17 0615  WBC 5.3  HGB 12.4  HCT 36.9  PLT 172   ------------------------------------------------------------------------------------------------------------------  Chemistries  Recent Labs  Lab 11/16/17 0615 11/16/17 0901  NA 139  --   K 3.8  --   CL 112*  --   CO2 23  --   GLUCOSE 92  --   BUN 13  --   CREATININE 0.56  --   CALCIUM 8.6*  --   AST  --  63*  ALT  --  104*  ALKPHOS  --  345*  BILITOT  --  0.3   ------------------------------------------------------------------------------------------------------------------  Cardiac Enzymes Recent Labs  Lab 11/15/17 2007  TROPONINI <0.03   ------------------------------------------------------------------------------------------------------------------  RADIOLOGY:  US Abdomen Limited Ruq  Result Date: 11/15/2017 CLINICAL DATA:  Elevated liver function tests. EXAM: ULTRASOUND ABDOMEN LIMITED RIGHT UPPER QUADRANT COMPARISON:  CT scan of June 10, 2017. Ultrasound of April 21, 2017. FINDINGS: Gallbladder: Status post cholecystectomy. Common bile duct: Diameter: 19 mm which is significantly dilated. Liver: No focal lesion identified. Within normal limits in parenchymal echogenicity. Portal vein is patent on color Doppler imaging with normal direction of blood flow towards the liver. IMPRESSION: Status post cholecystectomy. Common bile duct is significantly dilated at 19 mm concerning for distal common bile duct obstruction. Correlation with liver function tests and MRCP is recommended. Electronically Signed   By: Jeneen Rinks  Murlean Caller, M.D.   On: 11/15/2017 21:30    ASSESSMENT AND  PLAN:  *Acute abdominal pain secondary to unknown etiology  Suspected due to common bile duct pathology given abnormal ultrasound noted for common bile duct dilatation  Case discussed with gastroenterology-follow-up on MRCP results, liver function tests are improving   *Acute abnormal abdominal ultrasound  Plan of care as stated above   *Acute on chronic diarrhea status post cholecystectomy  GI panel -2 days ago   *Acute elevated liver function tests  Etiology unknown  Work-up as stated above   *Chronic anxiety disorder, NOS  Stable on current regiment   Disposition pending clinical course   All the records are reviewed and case discussed with Care Management/Social Workerr. Management plans discussed with the patient, family and they are in agreement.  CODE STATUS: full  TOTAL TIME TAKING CARE OF THIS PATIENT: 35 minutes.     POSSIBLE D/C IN 1-2 DAYS, DEPENDING ON CLINICAL CONDITION.   Avel Peace Salary M.D on 11/16/2017   Between 7am to 6pm - Pager - [phone]  After 6pm go to [email or web address] - password [email or web address] [facility]  Office  [phone]  CC: Primary care physician; [facility]  Note: This dictation was prepared with Dragon dictation along with smaller phrase technology. Any transcriptional errors that result from this process are unintentional.

## 2017-11-16 NOTE — H&P (Signed)
[place] [facility] [place] NAME: Lacey Ramsey    MR#:  [id number]  DATE OF BIRTH:  Apr 14, 1951  DATE OF ADMISSION:  11/15/2017  PRIMARY CARE PHYSICIAN: Shari Prows, [facility]   REQUESTING/REFERRING PHYSICIAN:   CHIEF COMPLAINT:   Chief Complaint  Patient presents with  . Abdominal Pain    HISTORY OF PRESENT ILLNESS: Lacey Ramsey  is a 67 y.o. female with a known history of IBS, anxiety disorder, hypothyroidism.  She is status post recent cholecystectomy, 6 months ago. Patient is here today for severe, frequent diarrhea and mid upper abdominal pain, radiating to the back.  No fever/chills, no nausea/vomiting.  Patient states she has been experiencing intermittent episodes of diarrhea since cholecystectomy, but not as severe as it has been in the past 2 weeks, up to 20 times a day.  She has tried Imodium and Pepto-Bismol without any improvement.  No recent antibiotic use, no new medications. Patient presented to her GI specialist 4 weeks and had stool studies done, including C. difficile test, which were negative.  She was seen in emergency room again, 2 days ago for the same reason and was prescribed Cipro and Flagyl.  After 24 hours of taking antibiotics her symptoms have not improved. Per patient, she had a colonoscopy within a year and this was negative for malignancy, but showed diverticulosis. Blood test done emergency room are remarkable for elevated liver function test with AST at 143, ALT at 160 and alk phos at 459.  Lipase level is elevated at 155. Abdominal ultrasound shows common bile duct, significantly dilated at 19 mm concerning for distal common bile duct obstruction. She is admitted for further evaluation and treatment.  PAST MEDICAL HISTORY:   Past Medical History:  Diagnosis Date  . Anxiety   . GERD (gastroesophageal reflux disease)   . Hypothyroidism   . IBS (irritable bowel syndrome)   . Insomnia     PAST  SURGICAL HISTORY:  Past Surgical History:  Procedure Laterality Date  . APPENDECTOMY    . CHOLECYSTECTOMY N/A 05/13/2017   Procedure: LAPAROSCOPIC CHOLECYSTECTOMY;  Surgeon: Olean Ree, MD;  Location: [facility] ORS;  Service: General;  Laterality: N/A;  . HERNIA REPAIR    . KNEE ARTHROSCOPY    . TONSILLECTOMY    . TUBAL LIGATION      SOCIAL HISTORY:  Social History   Tobacco Use  . Smoking status: Never Smoker  . Smokeless tobacco: Never Used  Substance Use Topics  . Alcohol use: Yes    Comment: rare     FAMILY HISTORY:  Family History  Problem Relation Age of Onset  . Cancer Mother   . Cancer Sister     DRUG ALLERGIES:  Allergies  Allergen Reactions  . Codeine Nausea And Vomiting    REVIEW OF SYSTEMS:   CONSTITUTIONAL: No fever, fatigue or weakness.  EYES: No blurred or double vision.  EARS, NOSE, AND THROAT: No tinnitus or ear pain.  RESPIRATORY: No cough, shortness of breath, wheezing or hemoptysis.  CARDIOVASCULAR: No chest pain, orthopnea, edema.  GASTROINTESTINAL: Positive for mid upper abdominal pain and diarrhea.  No nausea, vomiting.  GENITOURINARY: No dysuria, hematuria.  ENDOCRINE: No polyuria, nocturia,  HEMATOLOGY: No anemia, easy bruising or bleeding SKIN: No rash or lesion. MUSCULOSKELETAL: No joint pain or arthritis.   NEUROLOGIC: No tingling, numbness, weakness.  PSYCHIATRY: Positive history of anxiety disorder.   MEDICATIONS AT HOME:  Prior to Admission medications   Medication Sig Start  Date End Date Taking? Authorizing Provider  amitriptyline (ELAVIL) 25 MG tablet Take 25 mg by mouth at bedtime.   Yes [provider]  ciprofloxacin (CIPRO) 500 MG tablet Take 1 tablet (500 mg total) by mouth 2 (two) times daily for 10 days. 11/14/17 11/24/17 Yes Amyot, Nicholes Stairs, NP  FLUoxetine (PROZAC) 10 MG tablet Take 10 mg by mouth daily. 10/14/17  Yes [provider]  levothyroxine (SYNTHROID, LEVOTHROID) 50 MCG tablet Take 50 mcg by mouth  daily before breakfast.   Yes [provider]  metroNIDAZOLE (FLAGYL) 500 MG tablet Take 1 tablet (500 mg total) by mouth 2 (two) times daily for 10 days. 11/14/17 11/24/17 Yes Amyot, Nicholes Stairs, NP  pantoprazole (PROTONIX) 40 MG tablet Take 1 tablet (40 mg total) by mouth 2 (two) times daily before a meal. 06/13/17  Yes Clayburn Pert, MD  traZODone (DESYREL) 50 MG tablet Take 50 mg by mouth 2 (two) times daily.   Yes [provider]      PHYSICAL EXAMINATION:   VITAL SIGNS: Blood pressure 124/66, pulse 67, temperature 98 F (36.7 C), temperature source Oral, resp. rate 20, height '5\' 2"'$  (1.575 m), weight 61.1 kg (134 lb 11.2 oz), SpO2 100 %.  GENERAL:  67 y.o.-year-old patient lying in the bed with no acute distress, status post morphine IV.  EYES: Pupils equal, round, reactive to light and accommodation. No scleral icterus. Extraocular muscles intact.  HEENT: Head atraumatic, normocephalic. Oropharynx and nasopharynx clear.  NECK:  Supple, no jugular venous distention. No thyroid enlargement, no tenderness.  LUNGS: Normal breath sounds bilaterally, no wheezing, rales,rhonchi or crepitation. No use of accessory muscles of respiration.  CARDIOVASCULAR: S1, S2 normal. No murmurs, rubs, or gallops.  ABDOMEN: There is mild tenderness with deep palpation at the mid upper abdominal area.otherwise, the abdomen is soft, nondistended.  No guarding/rebound.  Bowel sounds present. No organomegaly or mass.  EXTREMITIES: No pedal edema, cyanosis, or clubbing.  NEUROLOGIC: No focal weakness. PSYCHIATRIC: The patient is alert and oriented x 3.  SKIN: No obvious rash, lesion, or ulcer.   LABORATORY PANEL:   CBC Recent Labs  Lab 11/15/17 2007  WBC 6.0  HGB 14.2  HCT 42.1  PLT 204  MCV 91.5  MCH 30.8  MCHC 33.6  RDW 13.8   ------------------------------------------------------------------------------------------------------------------  Chemistries  Recent Labs  Lab  11/15/17 2007  NA 141  K 4.1  CL 105  CO2 29  GLUCOSE 141*  BUN 12  CREATININE 0.83  CALCIUM 10.2  AST 143*  ALT 160*  ALKPHOS 459*  BILITOT 0.4   ------------------------------------------------------------------------------------------------------------------ estimated creatinine clearance is 55.8 mL/min (by C-G formula based on SCr of 0.83 mg/dL). ------------------------------------------------------------------------------------------------------------------ No results for input(s): TSH, T4TOTAL, T3FREE, THYROIDAB in the last 72 hours.  Invalid input(s): FREET3   Coagulation profile No results for input(s): INR, PROTIME in the last 168 hours. ------------------------------------------------------------------------------------------------------------------- No results for input(s): DDIMER in the last 72 hours. -------------------------------------------------------------------------------------------------------------------  Cardiac Enzymes Recent Labs  Lab 11/15/17 2007  TROPONINI <0.03   ------------------------------------------------------------------------------------------------------------------ Invalid input(s): POCBNP  ---------------------------------------------------------------------------------------------------------------  Urinalysis    Component Value Date/Time   COLORURINE YELLOW 03/06/2017 1933   APPEARANCEUR CLEAR 03/06/2017 1933   LABSPEC 1.020 03/06/2017 1933   PHURINE 7.0 03/06/2017 1933   GLUCOSEU NEGATIVE 03/06/2017 1933   HGBUR TRACE (A) 03/06/2017 [place] NEGATIVE 03/06/2017 [place] NEGATIVE 03/06/2017 1933   PROTEINUR NEGATIVE 03/06/2017 1933   NITRITE NEGATIVE 03/06/2017 [place] NEGATIVE 03/06/2017 1933  RADIOLOGY: US Abdomen Limited Ruq  Result Date: 11/15/2017 CLINICAL DATA:  Elevated liver function tests. EXAM: ULTRASOUND ABDOMEN LIMITED RIGHT UPPER QUADRANT COMPARISON:  CT scan of  June 10, 2017. Ultrasound of April 21, 2017. FINDINGS: Gallbladder: Status post cholecystectomy. Common bile duct: Diameter: 19 mm which is significantly dilated. Liver: No focal lesion identified. Within normal limits in parenchymal echogenicity. Portal vein is patent on color Doppler imaging with normal direction of blood flow towards the liver. IMPRESSION: Status post cholecystectomy. Common bile duct is significantly dilated at 19 mm concerning for distal common bile duct obstruction. Correlation with liver function tests and MRCP is recommended. Electronically Signed   By: Marijo Conception, M.D.   On: 11/15/2017 21:30    EKG: Orders placed or performed during the hospital encounter of 11/15/17  . EKG 12-Lead  . EKG 12-Lead  . ED EKG  . ED EKG    IMPRESSION AND PLAN:  1.  Upper abdominal pain, likely related to CBD dilatation and possibly, acute pancreatitis 2.  CBD dilatation, per abdominal ultrasound 3.  Acute on chronic diarrhea, that is post cholecystectomy 4.  Elevated liver function test 5.  Status post recent cholecystectomy  Plan:  -We will order MRCP for further evaluation -GI is consulted for further evaluation and treatment -Continue treatment with Cipro and Flagyl, started 24 hours ago -Continue supportive measures with pain medication and IV fluids -We will keep patient n.p.o.  -Continue Imodium, as needed for diarrhea. Questran might be helpful as well in this patient.   All the records are reviewed and case discussed with ED provider. Management plans discussed with the patient and she is in agreement.  CODE STATUS: FULL    Code Status Orders  (From admission, onward)        Start     Ordered   11/15/17 2351  Full code  Continuous     11/15/17 2350    Code Status History    This patient has a current code status but no historical code status.       TOTAL TIME TAKING CARE OF THIS PATIENT: 45 minutes.    Amelia Jo M.D on 11/16/2017 at 1:54  AM  Between 7am to 6pm - Pager - [phone]  After 6pm go to [email or web address] - password [email or web address] [facility]  [facility]  Office  [phone]  CC: Primary care physician; [facility]

## 2017-11-16 NOTE — Consult Note (Signed)
Lacey Ramsey , MD [address] [place], [place], [place], [address] 3940 [address] [place], [place], [place], [address] Phone: [phone]  Fax: [phone]  Consultation  Referring Provider: Dr Jerelyn Charles  Primary Care Physician:  [facility] Primary Gastroenterologist:  Dr. Allen Norris          Reason for Consultation:     Abnormal LFT's   Date of Admission:  11/15/2017 Date of Consultation:  11/16/2017         HPI:   Lacey Ramsey is a 67 y.o. female is a patient who underwent a cholecystectomy 6 months back.  She presented to the ER with abdominal pain severe diarrhea yesterday evening.  2 days back she presented to the ER with diarrhea she was given Cipro Flagyl discharged.  She came back to the ER as the pain was no better.  I was contacted to the emergency room yesterday because of abnormal liver function tests and a significantly dilated common bile duct at 19 mm concerning for obstruction of the common bile duct.  GI PCR panel performed on 11/14/2017 was negative.  Lipase elevated at 155.  Her liver function tests were completely normal 8 months back and yesterday had alkaline phosphatase of 459 ALT of 160 AST of 143 and total bilirubin of 0.4.  No leukocytosis hemoglobin of 14.2 g.  And normal with negative troponin.  Past Medical History:  Diagnosis Date  . Anxiety   . GERD (gastroesophageal reflux disease)   . Hypothyroidism   . IBS (irritable bowel syndrome)   . Insomnia     Past Surgical History:  Procedure Laterality Date  . APPENDECTOMY    . CHOLECYSTECTOMY N/A 05/13/2017   Procedure: LAPAROSCOPIC CHOLECYSTECTOMY;  Surgeon: Olean Ree, MD;  Location: [facility] ORS;  Service: General;  Laterality: N/A;  . HERNIA REPAIR    . KNEE ARTHROSCOPY    . TONSILLECTOMY    . TUBAL LIGATION      Prior to Admission medications   Medication Sig Start Date End Date Taking? Authorizing Provider  amitriptyline (ELAVIL) 25 MG tablet Take 25 mg by mouth at bedtime.    Yes [provider]  ciprofloxacin (CIPRO) 500 MG tablet Take 1 tablet (500 mg total) by mouth 2 (two) times daily for 10 days. 11/14/17 11/24/17 Yes Amyot, Nicholes Stairs, NP  FLUoxetine (PROZAC) 10 MG tablet Take 10 mg by mouth daily. 10/14/17  Yes [provider]  levothyroxine (SYNTHROID, LEVOTHROID) 50 MCG tablet Take 50 mcg by mouth daily before breakfast.   Yes [provider]  metroNIDAZOLE (FLAGYL) 500 MG tablet Take 1 tablet (500 mg total) by mouth 2 (two) times daily for 10 days. 11/14/17 11/24/17 Yes Amyot, Nicholes Stairs, NP  pantoprazole (PROTONIX) 40 MG tablet Take 1 tablet (40 mg total) by mouth 2 (two) times daily before a meal. 06/13/17  Yes Clayburn Pert, MD  traZODone (DESYREL) 50 MG tablet Take 50 mg by mouth 2 (two) times daily.   Yes [provider]    Family History  Problem Relation Age of Onset  . Cancer Mother   . Cancer Sister      Social History   Tobacco Use  . Smoking status: Never Smoker  . Smokeless tobacco: Never Used  Substance Use Topics  . Alcohol use: Yes    Comment: rare   . Drug use: No    Allergies as of 11/15/2017 - Review Complete 11/15/2017  Allergen Reaction Noted  . Codeine Nausea And Vomiting 03/06/2017  Review of Systems:    All systems reviewed and negative except where noted in HPI.   Physical Exam:  Vital signs in last 24 hours: Temp:  [97.7 F (36.5 C)-98.3 F (36.8 C)] 97.7 F (36.5 C) (06/23 0547) Pulse Rate:  [65-78] 65 (06/23 0547) Resp:  [18-20] 18 (06/23 0547) BP: (97-130)/(53-80) 97/53 (06/23 0547) SpO2:  [99 %-100 %] 99 % (06/23 0547) Weight:  [132 lb (59.9 kg)-134 lb 11.2 oz (61.1 kg)] 134 lb 11.2 oz (61.1 kg) (06/23 0233) Last BM Date: 11/15/17 General:   Pleasant, cooperative in NAD Head:  Normocephalic and atraumatic. Eyes:   No icterus.   Conjunctiva pink. PERRLA. Ears:  Normal auditory acuity. Neck:  Supple; no masses or thyroidomegaly Lungs: Respirations even and unlabored.  Lungs clear to auscultation bilaterally.   No wheezes, crackles, or rhonchi.  Heart:  Regular rate and rhythm;  Without murmur, clicks, rubs or gallops Abdomen:  Soft, nondistended, nontender. Normal bowel sounds. No appreciable masses or hepatomegaly.  No rebound or guarding.  Neurologic:  Alert and oriented x3;  grossly normal neurologically. Skin:  Intact without significant lesions or rashes. Cervical Nodes:  No significant cervical adenopathy. Psych:  Alert and cooperative. Normal affect.  LAB RESULTS: Recent Labs    11/15/17 2007 11/16/17 0615  WBC 6.0 5.3  HGB 14.2 12.4  HCT 42.1 36.9  PLT 204 172   BMET Recent Labs    11/15/17 2007 11/16/17 0615  NA 141 139  K 4.1 3.8  CL 105 112*  CO2 29 23  GLUCOSE 141* 92  BUN 12 13  CREATININE 0.83 0.56  CALCIUM 10.2 8.6*   LFT Recent Labs    11/15/17 2007  PROT 7.0  ALBUMIN 4.0  AST 143*  ALT 160*  ALKPHOS 459*  BILITOT 0.4   PT/INR No results for input(s): LABPROT, INR in the last 72 hours.  STUDIES: US Abdomen Limited Ruq  Result Date: 11/15/2017 CLINICAL DATA:  Elevated liver function tests. EXAM: ULTRASOUND ABDOMEN LIMITED RIGHT UPPER QUADRANT COMPARISON:  CT scan of June 10, 2017. Ultrasound of April 21, 2017. FINDINGS: Gallbladder: Status post cholecystectomy. Common bile duct: Diameter: 19 mm which is significantly dilated. Liver: No focal lesion identified. Within normal limits in parenchymal echogenicity. Portal vein is patent on color Doppler imaging with normal direction of blood flow towards the liver. IMPRESSION: Status post cholecystectomy. Common bile duct is significantly dilated at 19 mm concerning for distal common bile duct obstruction. Correlation with liver function tests and MRCP is recommended. Electronically Signed   By: Marijo Conception, M.D.   On: 11/15/2017 21:30      Impression / Plan:   Lacey Ramsey is a 67 y.o. y/o female underwent a cholecystectomy 6 months back.  She  presents to the emergency room with a few weeks history of diarrhea negative GI PCR panel and upper abdominal pain.  Her liver function tests are abnormal with elevated alkaline phosphatase AST and ALT but she has a normal total bilirubin.  A common bile duct is dilated to 19 mm on right upper quadrant ultrasound.  She has an intermediate pretest probability of a stone in the common bile duct.  This could be the cause of the abdominal pain.  Unclear why she has a diarrhea. Plan  1.  Obtain MRCP and if MRCP shows a stone in the common bile duct she will require an ERCP tomorrow.  Dr. Allen Norris is on call tomorrow.  2.  Obtain C. difficile antigen testing.  Fecal calprotectin.   Thank you for involving me in the care of this patient.      LOS: 1 day   Lacey Bellows, MD  11/16/2017, 8:20 AM

## 2017-11-17 ENCOUNTER — Inpatient Hospital Stay: Payer: Medicare Other | Admitting: Anesthesiology

## 2017-11-17 ENCOUNTER — Inpatient Hospital Stay: Payer: Medicare Other

## 2017-11-17 ENCOUNTER — Encounter
Admission: EM | Disposition: A | Discharge status: Home / Self Care | Payer: Self-pay | Source: Home / Self Care | Attending: Family Medicine

## 2017-11-17 ENCOUNTER — Encounter: Payer: Self-pay | Admitting: Anesthesiology

## 2017-11-17 DIAGNOSIS — K831 Obstruction of bile duct: Secondary | ICD-10-CM

## 2017-11-17 DIAGNOSIS — K838 Other specified diseases of biliary tract: Secondary | ICD-10-CM

## 2017-11-17 HISTORY — PX: ERCP: SHX5425

## 2017-11-17 LAB — COMPREHENSIVE METABOLIC PANEL
ALK PHOS: 332 U/L — AB (ref 38–126)
ALT: 111 U/L — ABNORMAL HIGH (ref 14–54)
AST: 123 U/L — ABNORMAL HIGH (ref 15–41)
Albumin: 3.3 g/dL — ABNORMAL LOW (ref 3.5–5.0)
Anion gap: 8 (ref 5–15)
BUN: 6 mg/dL (ref 6–20)
CALCIUM: 8.6 mg/dL — AB (ref 8.9–10.3)
CO2: 22 mmol/L (ref 22–32)
CREATININE: 0.58 mg/dL (ref 0.44–1.00)
Chloride: 107 mmol/L (ref 101–111)
Glucose, Bld: 101 mg/dL — ABNORMAL HIGH (ref 65–99)
Potassium: 3.3 mmol/L — ABNORMAL LOW (ref 3.5–5.1)
Sodium: 137 mmol/L (ref 135–145)
Total Bilirubin: 0.6 mg/dL (ref 0.3–1.2)
Total Protein: 5.8 g/dL — ABNORMAL LOW (ref 6.5–8.1)

## 2017-11-17 LAB — GLUCOSE, CAPILLARY: GLUCOSE-CAPILLARY: 73 mg/dL (ref 65–99)

## 2017-11-17 SURGERY — ERCP, WITH INTERVENTION IF INDICATED
Anesthesia: General

## 2017-11-17 MED ORDER — FENTANYL CITRATE (PF) 100 MCG/2ML IJ SOLN
INTRAMUSCULAR | Status: DC | PRN
  Administered 2017-11-17: 50 ug via INTRAVENOUS

## 2017-11-17 MED ORDER — POTASSIUM CHLORIDE 20 MEQ PO PACK: 40.0000 meq | PACK | Freq: Once | ORAL | Status: DC

## 2017-11-17 MED ORDER — FENTANYL CITRATE (PF) 100 MCG/2ML IJ SOLN
INTRAMUSCULAR | Status: AC
  Filled 2017-11-17: qty 2

## 2017-11-17 MED ORDER — INDOMETHACIN 50 MG RE SUPP
RECTAL | Status: AC
  Filled 2017-11-17: qty 2

## 2017-11-17 MED ORDER — MIDAZOLAM HCL 2 MG/2ML IJ SOLN
INTRAMUSCULAR | Status: AC
  Filled 2017-11-17: qty 2

## 2017-11-17 MED ORDER — MIDAZOLAM HCL 2 MG/2ML IJ SOLN
INTRAMUSCULAR | Status: DC | PRN
  Administered 2017-11-17: 2 mg via INTRAVENOUS

## 2017-11-17 MED ORDER — LACTATED RINGERS IV SOLN
INTRAVENOUS | Status: DC | PRN
  Administered 2017-11-17: 16:00:00 via INTRAVENOUS

## 2017-11-17 MED ORDER — PROPOFOL 500 MG/50ML IV EMUL
INTRAVENOUS | Status: DC | PRN
  Administered 2017-11-17: 150 ug/kg/min via INTRAVENOUS

## 2017-11-17 MED ORDER — PROPOFOL 10 MG/ML IV BOLUS
INTRAVENOUS | Status: DC | PRN
  Administered 2017-11-17: 50 mg via INTRAVENOUS

## 2017-11-17 MED ORDER — INDOMETHACIN 50 MG RE SUPP
100.0000 mg | Freq: Once | RECTAL | Status: AC
  Administered 2017-11-17: 100 mg via RECTAL

## 2017-11-17 NOTE — Anesthesia Post-op Follow-up Note (Signed)
Anesthesia QCDR form completed.        

## 2017-11-17 NOTE — Transfer of Care (Signed)
Immediate Anesthesia Transfer of Care Note  Patient: Lacey Ramsey  Procedure(s) Performed: ENDOSCOPIC RETROGRADE CHOLANGIOPANCREATOGRAPHY (ERCP) (N/A )  Patient Location: PACU  Anesthesia Type:General  Level of Consciousness: awake and sedated  Airway & Oxygen Therapy: Patient Spontanous Breathing and Patient connected to nasal cannula oxygen  Post-op Assessment: Report given to RN and Post -op Vital signs reviewed and stable  Post vital signs: Reviewed and stable  Last Vitals:  Vitals Value Taken Time  BP 125/62 11/17/2017  4:17 PM  Temp 36.6 C 11/17/2017  4:17 PM  Pulse 67 11/17/2017  4:17 PM  Resp 15 11/17/2017  4:17 PM  SpO2 99 % 11/17/2017  4:17 PM  Vitals shown include unvalidated device data.  Last Pain:  Vitals:   11/17/17 1617  TempSrc: Tympanic  PainSc:       Patients Stated Pain Goal: 1 (44/03/47 4259)  Complications: No apparent anesthesia complications

## 2017-11-17 NOTE — Progress Notes (Signed)
Patient is NPO for procedure. Per Dr Jerelyn Charles, will administer oral abx.

## 2017-11-17 NOTE — Anesthesia Procedure Notes (Signed)
Date/Time: 11/17/2017 4:00 PM Performed by: Nelda Marseille, CRNA Pre-anesthesia Checklist: Patient identified, Emergency Drugs available, Suction available, Patient being monitored and Timeout performed Oxygen Delivery Method: Nasal cannula

## 2017-11-17 NOTE — Progress Notes (Signed)
[facility] NAME: Lacey Ramsey    MR#:  [id number]  DATE OF BIRTH:  1951/02/09  SUBJECTIVE:  CHIEF COMPLAINT:   Chief Complaint  Patient presents with  . Abdominal Pain  Patient without complaint, case discussed with gastroenterology-for ERCP later today, MRCP noted  REVIEW OF SYSTEMS:  CONSTITUTIONAL: No fever, fatigue or weakness.  EYES: No blurred or double vision.  EARS, NOSE, AND THROAT: No tinnitus or ear pain.  RESPIRATORY: No cough, shortness of breath, wheezing or hemoptysis.  CARDIOVASCULAR: No chest pain, orthopnea, edema.  GASTROINTESTINAL: No nausea, vomiting, diarrhea or abdominal pain.  GENITOURINARY: No dysuria, hematuria.  ENDOCRINE: No polyuria, nocturia,  HEMATOLOGY: No anemia, easy bruising or bleeding SKIN: No rash or lesion. MUSCULOSKELETAL: No joint pain or arthritis.   NEUROLOGIC: No tingling, numbness, weakness.  PSYCHIATRY: No anxiety or depression.   ROS  DRUG ALLERGIES:   Allergies  Allergen Reactions  . Codeine Nausea And Vomiting    VITALS:  Blood pressure (!) 120/59, pulse 65, temperature 97.8 F (36.6 C), temperature source Oral, resp. rate 16, height 5\' 2"  (1.575 m), weight 62 kg (136 lb 9.6 oz), SpO2 100 %.  PHYSICAL EXAMINATION:  GENERAL:  67 y.o.-year-old patient lying in the bed with no acute distress.  EYES: Pupils equal, round, reactive to light and accommodation. No scleral icterus. Extraocular muscles intact.  HEENT: Head atraumatic, normocephalic. Oropharynx and nasopharynx clear.  NECK:  Supple, no jugular venous distention. No thyroid enlargement, no tenderness.  LUNGS: Normal breath sounds bilaterally, no wheezing, rales,rhonchi or crepitation. No use of accessory muscles of respiration.  CARDIOVASCULAR: S1, S2 normal. No murmurs, rubs, or gallops.  ABDOMEN: Soft, nontender, nondistended. Bowel sounds present. No organomegaly or mass.  EXTREMITIES: No pedal edema,  cyanosis, or clubbing.  NEUROLOGIC: Cranial nerves II through XII are intact. Muscle strength 5/5 in all extremities. Sensation intact. Gait not checked.  PSYCHIATRIC: The patient is alert and oriented x 3.  SKIN: No obvious rash, lesion, or ulcer.   Physical Exam LABORATORY PANEL:   CBC Recent Labs  Lab 11/16/17 0615  WBC 5.3  HGB 12.4  HCT 36.9  PLT 172   ------------------------------------------------------------------------------------------------------------------  Chemistries  Recent Labs  Lab 11/17/17 0501  NA 137  K 3.3*  CL 107  CO2 22  GLUCOSE 101*  BUN 6  CREATININE 0.58  CALCIUM 8.6*  AST 123*  ALT 111*  ALKPHOS 332*  BILITOT 0.6   ------------------------------------------------------------------------------------------------------------------  Cardiac Enzymes Recent Labs  Lab 11/15/17 2007  TROPONINI <0.03   ------------------------------------------------------------------------------------------------------------------  RADIOLOGY:  Mr 3d Recon At Scanner  Result Date: 11/16/2017 CLINICAL DATA:  67 year old female with history of abnormal liver function tests. EXAM: MRI ABDOMEN WITHOUT AND WITH CONTRAST (INCLUDING MRCP) TECHNIQUE: Multiplanar multisequence MR imaging of the abdomen was performed both before and after the administration of intravenous contrast. Heavily T2-weighted images of the biliary and pancreatic ducts were obtained, and three-dimensional MRCP images were rendered by post processing. CONTRAST:  45mL MULTIHANCE GADOBENATE DIMEGLUMINE 529 MG/ML IV SOLN COMPARISON:  None. FINDINGS: Lower chest: Unremarkable. Hepatobiliary: No suspicious cystic or solid hepatic lesions. Status post cholecystectomy. Moderate intrahepatic and severe extrahepatic biliary ductal dilatation. Common hepatic duct measures 20 mm in diameter. Common bile duct measures 14 mm in diameter. No filling defect in the common bile duct to suggest choledocholithiasis.  Abrupt transition in caliber of the distal common bile duct in the pancreatic head immediately before the ampulla. Pancreas: No pancreatic mass.  MRCP images demonstrate diffuse pancreatic ductal dilatation measuring up to 5 mm. No pancreatic or peripancreatic fluid collections or inflammatory changes. Spleen:  Unremarkable. Adrenals/Urinary Tract: 1 cm T1 hypointense with some dependent T1 hyperintensity, mildly T2 hyperintense nonenhancing lesion in the interpolar region of the right kidney, compatible with a mildly complex cyst with some dependent proteinaceous/hemorrhagic debris. Left kidney and bilateral adrenal glands are normal in appearance. No hydroureteronephrosis in the visualized portions of the abdomen. Stomach/Bowel: Visualized portions are unremarkable. Vascular/Lymphatic: No aneurysm identified in the visualized abdominal vasculature. No lymphadenopathy noted in the abdomen. Other:  Trace amount of perisplenic ascites. Musculoskeletal: No aggressive appearing osseous lesions are noted in the visualized portions of the skeleton. IMPRESSION: 1. Moderate intrahepatic and severe extrahepatic biliary ductal dilatation, as well as mild pancreatic ductal dilatation. Although no discrete pancreatic head lesion is confidently identified on today's examination, the possibility of a subtle ampullary lesion or other pancreatic lesion in the pancreatic head which is not detectable by MRI should be considered. Further evaluation with ERCP and endoscopic ultrasound should be considered in the near future. 2. No choledocholithiasis. 3. Status post cholecystectomy. 4. Trace amount of perisplenic ascites. Electronically Signed   By: Vinnie Langton M.D.   On: 11/16/2017 16:16   Mr Abdomen Mrcp Moise Boring Contast  Result Date: 11/16/2017 CLINICAL DATA:  67 year old female with history of abnormal liver function tests. EXAM: MRI ABDOMEN WITHOUT AND WITH CONTRAST (INCLUDING MRCP) TECHNIQUE: Multiplanar multisequence MR  imaging of the abdomen was performed both before and after the administration of intravenous contrast. Heavily T2-weighted images of the biliary and pancreatic ducts were obtained, and three-dimensional MRCP images were rendered by post processing. CONTRAST:  28mL MULTIHANCE GADOBENATE DIMEGLUMINE 529 MG/ML IV SOLN COMPARISON:  None. FINDINGS: Lower chest: Unremarkable. Hepatobiliary: No suspicious cystic or solid hepatic lesions. Status post cholecystectomy. Moderate intrahepatic and severe extrahepatic biliary ductal dilatation. Common hepatic duct measures 20 mm in diameter. Common bile duct measures 14 mm in diameter. No filling defect in the common bile duct to suggest choledocholithiasis. Abrupt transition in caliber of the distal common bile duct in the pancreatic head immediately before the ampulla. Pancreas: No pancreatic mass. MRCP images demonstrate diffuse pancreatic ductal dilatation measuring up to 5 mm. No pancreatic or peripancreatic fluid collections or inflammatory changes. Spleen:  Unremarkable. Adrenals/Urinary Tract: 1 cm T1 hypointense with some dependent T1 hyperintensity, mildly T2 hyperintense nonenhancing lesion in the interpolar region of the right kidney, compatible with a mildly complex cyst with some dependent proteinaceous/hemorrhagic debris. Left kidney and bilateral adrenal glands are normal in appearance. No hydroureteronephrosis in the visualized portions of the abdomen. Stomach/Bowel: Visualized portions are unremarkable. Vascular/Lymphatic: No aneurysm identified in the visualized abdominal vasculature. No lymphadenopathy noted in the abdomen. Other:  Trace amount of perisplenic ascites. Musculoskeletal: No aggressive appearing osseous lesions are noted in the visualized portions of the skeleton. IMPRESSION: 1. Moderate intrahepatic and severe extrahepatic biliary ductal dilatation, as well as mild pancreatic ductal dilatation. Although no discrete pancreatic head lesion is  confidently identified on today's examination, the possibility of a subtle ampullary lesion or other pancreatic lesion in the pancreatic head which is not detectable by MRI should be considered. Further evaluation with ERCP and endoscopic ultrasound should be considered in the near future. 2. No choledocholithiasis. 3. Status post cholecystectomy. 4. Trace amount of perisplenic ascites. Electronically Signed   By: Vinnie Langton M.D.   On: 11/16/2017 16:16   US Abdomen Limited Ruq  Result Date: 11/15/2017 CLINICAL DATA:  Elevated liver function tests. EXAM: ULTRASOUND ABDOMEN LIMITED RIGHT UPPER QUADRANT COMPARISON:  CT scan of June 10, 2017. Ultrasound of April 21, 2017. FINDINGS: Gallbladder: Status post cholecystectomy. Common bile duct: Diameter: 19 mm which is significantly dilated. Liver: No focal lesion identified. Within normal limits in parenchymal echogenicity. Portal vein is patent on color Doppler imaging with normal direction of blood flow towards the liver. IMPRESSION: Status post cholecystectomy. Common bile duct is significantly dilated at 19 mm concerning for distal common bile duct obstruction. Correlation with liver function tests and MRCP is recommended. Electronically Signed   By: Marijo Conception, M.D.   On: 11/15/2017 21:30    ASSESSMENT AND PLAN:  *Acute abdominal pain secondary to unknown etiology  Suspected due to common bile duct pathology given abnormal ultrasound noted for common bile duct dilatation  MRCP noted for possible ampullary narrowing/stricture, noted biliary and pancreatic duct dilatation Case discussed with gastroenterology-for ERCP later today, continue n.p.o. status, CMP daily, continue close medical monitoring    *Acute abnormal abdominal ultrasound/MRCP Plan of care as stated above   *Acute on chronic diarrhea status post cholecystectomy  GI panel negative 2 days ago prior to admission   *Acute elevated liver function tests  Etiology unknown   Work-up as stated above   *Chronic anxiety disorder, NOS  Stable on current regiment   Disposition pending clinical course  All the records are reviewed and case discussed with Care Management/Social Workerr. Management plans discussed with the patient, family and they are in agreement.  CODE STATUS: full  TOTAL TIME TAKING CARE OF THIS PATIENT: 35 minutes.     POSSIBLE D/C IN 1-3 DAYS, DEPENDING ON CLINICAL CONDITION.   Avel Peace Jashon Ishida M.D on 11/17/2017   Between 7am to 6pm - Pager - [phone]  After 6pm go to [email or web address] - password [email or web address] [facility]  Office  [phone]  CC: Primary care physician; [facility]  Note: This dictation was prepared with Dragon dictation along with smaller phrase technology. Any transcriptional errors that result from this process are unintentional.

## 2017-11-17 NOTE — Anesthesia Preprocedure Evaluation (Signed)
Anesthesia Evaluation  Patient identified by MRN, date of birth, ID band Patient awake    Reviewed: Allergy & Precautions, NPO status , Patient's Chart, lab work & pertinent test results, reviewed documented beta blocker date and time   Airway Mallampati: II  TM Distance: >3 FB     Dental  (+) Chipped   Pulmonary           Cardiovascular      Neuro/Psych PSYCHIATRIC DISORDERS Anxiety  Neuromuscular disease    GI/Hepatic hiatal hernia, GERD  ,  Endo/Other  Hypothyroidism   Renal/GU      Musculoskeletal   Abdominal   Peds  Hematology   Anesthesia Other Findings Hx of PVCs. EKG now ok.  Reproductive/Obstetrics                             Anesthesia Physical Anesthesia Plan  ASA: III  Anesthesia Plan: General   Post-op Pain Management:    Induction: Intravenous  PONV Risk Score and Plan:   Airway Management Planned:   Additional Equipment:   Intra-op Plan:   Post-operative Plan:   Informed Consent: I have reviewed the patients History and Physical, chart, labs and discussed the procedure including the risks, benefits and alternatives for the proposed anesthesia with the patient or authorized representative who has indicated his/her understanding and acceptance.     Plan Discussed with: CRNA  Anesthesia Plan Comments:         Anesthesia Quick Evaluation

## 2017-11-17 NOTE — Anesthesia Postprocedure Evaluation (Signed)
Anesthesia Post Note  Patient: Lacey Ramsey  Procedure(s) Performed: ENDOSCOPIC RETROGRADE CHOLANGIOPANCREATOGRAPHY (ERCP) (N/A )  Patient location during evaluation: Endoscopy Anesthesia Type: General Level of consciousness: awake and alert Pain management: pain level controlled Vital Signs Assessment: post-procedure vital signs reviewed and stable Respiratory status: spontaneous breathing, nonlabored ventilation, respiratory function stable and patient connected to nasal cannula oxygen Cardiovascular status: blood pressure returned to baseline and stable Postop Assessment: no apparent nausea or vomiting Anesthetic complications: no     Last Vitals:  Vitals:   11/17/17 1355 11/17/17 1617  BP: (!) 127/59 125/62  Pulse: 78 67  Resp: 18 15  Temp: (!) 35.7 C 36.6 C  SpO2: 100% 100%    Last Pain:  Vitals:   11/17/17 1617  TempSrc: Tympanic  PainSc:                  [name] S

## 2017-11-17 NOTE — Op Note (Signed)
[facility] Patient Name: Lacey Ramsey Procedure Date: 11/17/2017 3:56 PM MRN: [id number] Account #: [id number] Date of Birth: 1951/04/28 Admit Type: Inpatient Age: 67 Room: [facility] ENDO ROOM 2 Gender: Female Note Status: Finalized Procedure:            ERCP Indications:          Biliary dilation on Computed Tomogram Scan Providers:            Lucilla Lame MD, MD Referring MD:         No Local Md, MD (Referring MD) Medicines:            Propofol per Anesthesia Complications:        No immediate complications. Procedure:            Pre-Anesthesia Assessment:                       - Prior to the procedure, a History and Physical was                        performed, and patient medications and allergies were                        reviewed. The patient's tolerance of previous                        anesthesia was also reviewed. The risks and benefits of                        the procedure and the sedation options and risks were                        discussed with the patient. All questions were                        answered, and informed consent was obtained. Prior                        Anticoagulants: The patient has taken no previous                        anticoagulant or antiplatelet agents. ASA Grade                        Assessment: II - A patient with mild systemic disease.                        After reviewing the risks and benefits, the patient was                        deemed in satisfactory condition to undergo the                        procedure.                       After obtaining informed consent, the scope was passed                        under direct vision. Throughout the procedure, the  patient's blood pressure, pulse, and oxygen saturations                        were monitored continuously. The Duodenoscope was                        introduced through the mouth, and used to inject             contrast into and used to inject contrast into the bile                        duct. The ERCP was accomplished without difficulty. The                        patient tolerated the procedure well. Findings:      A scout film of the abdomen was obtained. Surgical clips, consistent       with previous cholecystectomy, were seen in the area of the cystic duct.       The esophagus was successfully intubated under direct vision. The scope       was advanced to a normal major papilla in the descending duodenum       without detailed examination of the pharynx, larynx and associated       structures, and upper GI tract. The upper GI tract was grossly normal.       The bile duct was deeply cannulated with the short-nosed traction       sphincterotome. Contrast was injected. I personally interpreted the bile       duct images. There was brisk flow of contrast through the ducts. Image       quality was excellent. Contrast extended to the entire biliary tree. The       main bile duct was diffusely dilated. The largest diameter was 14 mm.       The in the biliary system contained a single mild stenosis. A wire was       passed into the biliary tree. A 10 mm biliary sphincterotomy was made       with a traction (standard) sphincterotome using ERBE electrocautery.       There was no post-sphincterotomy bleeding. 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 The                        stricture was benign appearing.                       - The entire main bile duct was dilated.                       - A biliary sphincterotomy was performed.                       - Cells for cytology obtained. Recommendation:       - Return patient to hospital ward for ongoing care. Procedure Code(s):    --- Professional ---                       [phone], Endoscopic retrograde cholangiopancreatography                        (  ERCP);  with sphincterotomy/papillotomy                       [phone], Endoscopic catheterization of the biliary ductal                        system, radiological supervision and interpretation Diagnosis Code(s):    --- Professional ---                       K83.8, Other specified diseases of biliary tract                       K83.1, Obstruction of bile duct CPT copyright 2017 [employer]. All rights reserved. The codes documented in this report are preliminary and upon coder review may  be revised to meet current compliance requirements. Lucilla Lame MD, MD 11/17/2017 4:11:08 PM This report has been signed electronically. Number of Addenda: 0 Note Initiated On: 11/17/2017 3:56 PM      [facility]

## 2017-11-18 LAB — COMPREHENSIVE METABOLIC PANEL
ALT: 80 U/L — AB (ref 0–44)
AST: 66 U/L — ABNORMAL HIGH (ref 15–41)
Albumin: 2.9 g/dL — ABNORMAL LOW (ref 3.5–5.0)
Alkaline Phosphatase: 279 U/L — ABNORMAL HIGH (ref 38–126)
Anion gap: 9 (ref 5–15)
BILIRUBIN TOTAL: 0.8 mg/dL (ref 0.3–1.2)
BUN: 5 mg/dL — ABNORMAL LOW (ref 8–23)
CALCIUM: 8.6 mg/dL — AB (ref 8.9–10.3)
CHLORIDE: 110 mmol/L (ref 98–111)
CO2: 20 mmol/L — ABNORMAL LOW (ref 22–32)
Creatinine, Ser: 0.56 mg/dL (ref 0.44–1.00)
Glucose, Bld: 61 mg/dL — ABNORMAL LOW (ref 70–99)
Potassium: 3.8 mmol/L (ref 3.5–5.1)
Sodium: 139 mmol/L (ref 135–145)
TOTAL PROTEIN: 5.2 g/dL — AB (ref 6.5–8.1)

## 2017-11-18 LAB — GLUCOSE, CAPILLARY
GLUCOSE-CAPILLARY: 56 mg/dL — AB (ref 70–99)
Glucose-Capillary: 97 mg/dL (ref 70–99)

## 2017-11-18 LAB — HIV ANTIBODY (ROUTINE TESTING W REFLEX): HIV Screen 4th Generation wRfx: NONREACTIVE

## 2017-11-18 MED ORDER — LOPERAMIDE HCL 2 MG PO CAPS: 2.0000 mg | ORAL_CAPSULE | ORAL | 0 refills | Status: AC | PRN

## 2017-11-18 MED ORDER — LOPERAMIDE HCL 2 MG PO CAPS: 2.0000 mg | ORAL_CAPSULE | ORAL | Status: DC | PRN

## 2017-11-18 MED ORDER — LOPERAMIDE HCL 2 MG PO CAPS
2.0000 mg | ORAL_CAPSULE | Freq: Once | ORAL | Status: AC | PRN
  Administered 2017-11-18: 2 mg via ORAL
  Filled 2017-11-18: qty 1

## 2017-11-18 NOTE — Discharge Summary (Addendum)
[facility] [place] NAME: Lacey Ramsey    MR#:  [id number]  DATE OF BIRTH:  13-Aug-1950  DATE OF ADMISSION:  11/15/2017 ADMITTING PHYSICIAN: Amelia Jo, MD  DATE OF DISCHARGE: No discharge date for patient encounter.  PRIMARY CARE PHYSICIAN: [place], [facility]    ADMISSION DIAGNOSIS:  Common bile duct (CBD) obstruction [K83.1] Pain of upper abdomen [R10.10]  DISCHARGE DIAGNOSIS:  Active Problems:   Abdominal pain   Common bile duct (CBD) obstruction   Other specified diseases of biliary tract   SECONDARY DIAGNOSIS:   Past Medical History:  Diagnosis Date  . Anxiety   . GERD (gastroesophageal reflux disease)   . Hypothyroidism   . IBS (irritable bowel syndrome)   . Insomnia     HOSPITAL COURSE:  *Acute abdominal pain secondary to unknown etiology  Resolved-patient much improved Suspected due to common bile duct pathology given abnormal ultrasound noted for common bile duct dilatation  MRCP noted for possible ampullary narrowing/stricture, noted biliary and pancreatic duct dilatation Status post ERCP by gastroenterology/Dr. 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, tolerating diet without difficulty, doing well, gastroenterology recommending follow-up with primary care provider for liver function test reevaluation 1 week   *Acute abnormal abdominal ultrasound/MRCP Plan of care as stated above  *Acute on chronic noninfectious diarrhea status post cholecystectomy  Resolved GI panel negative 2 days ago prior to admission Continue Imodium as needed  *Acute elevated liver function tests  Resolving  status post biliary sphincterectomy  Plan of care as stated above  *Chronic anxiety disorder, NOS  Stable on current regiment    DISCHARGE CONDITIONS:   Stable  CONSULTS OBTAINED:  Treatment Team:  Jonathon Bellows, MD  DRUG  ALLERGIES:   Allergies  Allergen Reactions  . Codeine Nausea And Vomiting    DISCHARGE MEDICATIONS:   Allergies as of 11/18/2017      Reactions   Codeine Nausea And Vomiting      Medication List    TAKE these medications   amitriptyline 25 MG tablet Commonly known as:  ELAVIL Take 25 mg by mouth at bedtime.   ciprofloxacin 500 MG tablet Commonly known as:  CIPRO Take 1 tablet (500 mg total) by mouth 2 (two) times daily for 10 days.   FLUoxetine 10 MG tablet Commonly known as:  PROZAC Take 10 mg by mouth daily.   levothyroxine 50 MCG tablet Commonly known as:  SYNTHROID, LEVOTHROID Take 50 mcg by mouth daily before breakfast.   loperamide 2 MG capsule Commonly known as:  IMODIUM Take 1 capsule (2 mg total) by mouth as needed for diarrhea or loose stools.   metroNIDAZOLE 500 MG tablet Commonly known as:  FLAGYL Take 1 tablet (500 mg total) by mouth 2 (two) times daily for 10 days.   pantoprazole 40 MG tablet Commonly known as:  PROTONIX Take 1 tablet (40 mg total) by mouth 2 (two) times daily before a meal.   traZODone 50 MG tablet Commonly known as:  DESYREL Take 50 mg by mouth 2 (two) times daily.        DISCHARGE INSTRUCTIONS:  if you experience worsening of your admission symptoms, develop shortness of breath, life threatening emergency, suicidal or homicidal thoughts you must seek medical attention immediately by calling 911 or calling your MD immediately  if symptoms less severe.  You Must read complete instructions/literature along with all the possible adverse reactions/side effects for all  the Medicines you take and that have been prescribed to you. Take any new Medicines after you have completely understood and accept all the possible adverse reactions/side effects.   Please note  You were cared for by a hospitalist during your hospital stay. If you have any questions about your discharge medications or the care you received while you were in the  hospital after you are discharged, you can call the unit and asked to speak with the hospitalist on call if the hospitalist that took care of you is not available. Once you are discharged, your primary care physician will handle any further medical issues. Please note that NO REFILLS for any discharge medications will be authorized once you are discharged, as it is imperative that you return to your primary care physician (or establish a relationship with a primary care physician if you do not have one) for your aftercare needs so that they can reassess your need for medications and monitor your lab values.    Today   CHIEF COMPLAINT:   Chief Complaint  Patient presents with  . Abdominal Pain    HISTORY OF PRESENT ILLNESS:  67 y.o. female with a known history of IBS, anxiety disorder, hypothyroidism.  She is status post recent cholecystectomy, 6 months ago. Patient is here today for severe, frequent diarrhea and mid upper abdominal pain, radiating to the back.  No fever/chills, no nausea/vomiting.  Patient states she has been experiencing intermittent episodes of diarrhea since cholecystectomy, but not as severe as it has been in the past 2 weeks, up to 20 times a day.  She has tried Imodium and Pepto-Bismol without any improvement.  No recent antibiotic use, no new medications. Patient presented to her GI specialist 4 weeks and had stool studies done, including C. difficile test, which were negative.  She was seen in emergency room again, 2 days ago for the same reason and was prescribed Cipro and Flagyl.  After 24 hours of taking antibiotics her symptoms have not improved. Per patient, she had a colonoscopy within a year and this was negative for malignancy, but showed diverticulosis. Blood test done emergency room are remarkable for elevated liver function test with AST at 143, ALT at 160 and alk phos at 459.  Lipase level is elevated at 155. Abdominal ultrasound shows common bile duct,  significantly dilated at 19 mm concerning for distal common bile duct obstruction. She is admitted for further evaluation and treatment. VITAL SIGNS:  Blood pressure 139/62, pulse 66, temperature 97.9 F (36.6 C), temperature source Oral, resp. rate 16, height '5\' 2"'$  (1.575 m), weight 60.3 kg (132 lb 15 oz), SpO2 100 %.  I/O:    Intake/Output Summary (Last 24 hours) at 11/18/2017 1150 Last data filed at 11/18/2017 0959 Gross per 24 hour  Intake 1040 ml  Output 1 ml  Net 1039 ml    PHYSICAL EXAMINATION:  GENERAL:  67 y.o.-year-old patient lying in the bed with no acute distress.  EYES: Pupils equal, round, reactive to light and accommodation. No scleral icterus. Extraocular muscles intact.  HEENT: Head atraumatic, normocephalic. Oropharynx and nasopharynx clear.  NECK:  Supple, no jugular venous distention. No thyroid enlargement, no tenderness.  LUNGS: Normal breath sounds bilaterally, no wheezing, rales,rhonchi or crepitation. No use of accessory muscles of respiration.  CARDIOVASCULAR: S1, S2 normal. No murmurs, rubs, or gallops.  ABDOMEN: Soft, non-tender, non-distended. Bowel sounds present. No organomegaly or mass.  EXTREMITIES: No pedal edema, cyanosis, or clubbing.  NEUROLOGIC: Cranial nerves II through  XII are intact. Muscle strength 5/5 in all extremities. Sensation intact. Gait not checked.  PSYCHIATRIC: The patient is alert and oriented x 3.  SKIN: No obvious rash, lesion, or ulcer.   DATA REVIEW:   CBC Recent Labs  Lab 11/16/17 0615  WBC 5.3  HGB 12.4  HCT 36.9  PLT 172    Chemistries  Recent Labs  Lab 11/18/17 0530  NA 139  K 3.8  CL 110  CO2 20*  GLUCOSE 61*  BUN 5*  CREATININE 0.56  CALCIUM 8.6*  AST 66*  ALT 80*  ALKPHOS 279*  BILITOT 0.8    Cardiac Enzymes Recent Labs  Lab 11/15/17 2007  TROPONINI <0.03    Microbiology Results  Results for orders placed or performed during the hospital encounter of 11/15/17  C difficile quick scan w  PCR reflex     Status: None   Collection Time: 11/16/17  9:45 AM  Result Value Ref Range Status   C Diff antigen NEGATIVE NEGATIVE Final   C Diff toxin NEGATIVE NEGATIVE Final   C Diff interpretation No C. difficile detected.  Final    Comment: Performed at [facility], [address] [place],  [address]    RADIOLOGY:  Dg C-arm 1-60 Min-no Report  Result Date: 11/17/2017 Fluoroscopy was utilized by the requesting physician.  No radiographic interpretation.    EKG:   Orders placed or performed during the hospital encounter of 11/15/17  . EKG 12-Lead  . EKG 12-Lead  . ED EKG  . ED EKG      Management plans discussed with the patient, family and they are in agreement.  CODE STATUS:     Code Status Orders  (From admission, onward)        Start     Ordered   11/15/17 2351  Full code  Continuous     11/15/17 2350    Code Status History    This patient has a current code status but no historical code status.      TOTAL TIME TAKING CARE OF THIS PATIENT: 45 minutes.    Avel Peace Salary M.D on 11/18/2017 at 11:50 AM  Between 7am to 6pm - Pager - [phone]  After 6pm go to [email or web address] - password [email or web address] [facility]  Office  [phone]  CC: Primary care physician; [facility]   Note: This dictation was prepared with Dragon dictation along with smaller phrase technology. Any transcriptional errors that result from this process are unintentional.

## 2017-11-18 NOTE — Progress Notes (Signed)
Lacey Ramsey to be D/C'd Home per MD order.  Discussed prescriptions and follow up appointments with the patient. Prescriptions given to patient, medication list explained in detail. Pt verbalized understanding.  Allergies as of 11/18/2017      Reactions   Codeine Nausea And Vomiting      Medication List    TAKE these medications   amitriptyline 25 MG tablet Commonly known as:  ELAVIL Take 25 mg by mouth at bedtime.   ciprofloxacin 500 MG tablet Commonly known as:  CIPRO Take 1 tablet (500 mg total) by mouth 2 (two) times daily for 10 days.   FLUoxetine 10 MG tablet Commonly known as:  PROZAC Take 10 mg by mouth daily.   levothyroxine 50 MCG tablet Commonly known as:  SYNTHROID, LEVOTHROID Take 50 mcg by mouth daily before breakfast.   loperamide 2 MG capsule Commonly known as:  IMODIUM Take 1 capsule (2 mg total) by mouth as needed for diarrhea or loose stools.   metroNIDAZOLE 500 MG tablet Commonly known as:  FLAGYL Take 1 tablet (500 mg total) by mouth 2 (two) times daily for 10 days.   pantoprazole 40 MG tablet Commonly known as:  PROTONIX Take 1 tablet (40 mg total) by mouth 2 (two) times daily before a meal.   traZODone 50 MG tablet Commonly known as:  DESYREL Take 50 mg by mouth 2 (two) times daily.       Vitals:   11/18/17 0357 11/18/17 1147  BP: (!) 99/57 139/62  Pulse: 76 66  Resp: 18 16  Temp: (!) 97.4 F (36.3 C) 97.9 F (36.6 C)  SpO2: 96% 100%    Skin clean, dry and intact without evidence of skin break down, no evidence of skin tears noted. IV catheter discontinued intact. Site without signs and symptoms of complications. Dressing and pressure applied. Pt denies pain at this time. No complaints noted.  An After Visit Summary was printed and given to the patient. Patient escorted via husband, and D/C home via private auto.  Sharalyn Ink

## 2017-11-18 NOTE — Care Management Important Message (Signed)
Copy of signed IM left with patient in room.  

## 2017-11-19 LAB — CALPROTECTIN, FECAL: Calprotectin, Fecal: 39 ug/g (ref 0–120)

## 2017-11-20 ENCOUNTER — Encounter: Payer: Self-pay | Admitting: Gastroenterology

## 2017-11-20 ENCOUNTER — Telehealth: Payer: Self-pay

## 2017-11-20 NOTE — Telephone Encounter (Signed)
EMMI Follow-up: Ms. Lacey Ramsey called as she said she had received a call from my number and was checking to see why she was called. I explained our process of sending two automated calls post discharge each with a series of different questions.  I asked is she had gotten Rx filled and aware of follow-up appointments.  She said she had to quit taking the antibiotic as was causing her to have severe diarrhea (had taken Fri-Wed.).  Said she felt fine and was continuing with the Imodium. She has a follow-up appointment on Monday with PCP and will ask if there is anything else she should be taking. No needs noted today.

## 2017-11-21 LAB — CYTOLOGY - NON PAP

## 2017-11-24 DIAGNOSIS — E039 Hypothyroidism, unspecified: Secondary | ICD-10-CM | POA: Diagnosis not present

## 2017-11-24 DIAGNOSIS — K580 Irritable bowel syndrome with diarrhea: Secondary | ICD-10-CM | POA: Diagnosis not present

## 2017-11-24 DIAGNOSIS — R748 Abnormal levels of other serum enzymes: Secondary | ICD-10-CM | POA: Diagnosis not present

## 2017-11-24 DIAGNOSIS — G4709 Other insomnia: Secondary | ICD-10-CM | POA: Diagnosis not present

## 2017-12-03 ENCOUNTER — Telehealth: Payer: Self-pay

## 2017-12-03 ENCOUNTER — Ambulatory Visit (INDEPENDENT_AMBULATORY_CARE_PROVIDER_SITE_OTHER): Payer: Medicare Other | Admitting: Gastroenterology

## 2017-12-03 ENCOUNTER — Encounter: Payer: Self-pay | Admitting: Gastroenterology

## 2017-12-03 ENCOUNTER — Other Ambulatory Visit: Payer: Self-pay

## 2017-12-03 VITALS — BP 103/60 | HR 82 | Ht 62.0 in | Wt 129.7 lb

## 2017-12-03 DIAGNOSIS — R197 Diarrhea, unspecified: Secondary | ICD-10-CM

## 2017-12-03 DIAGNOSIS — K838 Other specified diseases of biliary tract: Secondary | ICD-10-CM

## 2017-12-03 NOTE — Patient Instructions (Addendum)
You are scheduled for a RUQ abdominal US at [facility] on Wednesday, August 7th at 10:15am. Please arrive at the registration desk at 10:00am. You cannot have anything to eat or drink after midnight on Tuesday night.   If you need to reschedule this appointment for any reason, please contact central scheduling at [phone].

## 2017-12-03 NOTE — Progress Notes (Signed)
Primary Care Physician: [facility]  Primary Gastroenterologist:  Dr. Lucilla Lame  Chief Complaint  Patient presents with  . Diarrhea    HPI: Lacey Ramsey is a 67 y.o. female here for follow-up after having an ERCP. This patient had seen me back in November 2018 and had some epigastric abdominal pain.  The patient was set up for an ultrasound of the gallbladder with an ejection fraction. The patient then followed up one week later to review the results of a 5 mm polyp with a 14 mm mass seen in the gallbladder. The patient subsequently had her gallbladder removed.  She was also recommended to have an upper endoscopy which it appears she did not have by me but decided to go to a gastrologist at [facility] whereupon she had an upper endoscopy. The patient was admitted in June for abdominal pain and was found to have a dilated common bile duct and had an ERCP with brushings of the distal common bile duct stricture that did not show any abnormalities. It was reported that there was not a significant amount of cells to make a definitive diagnosis. Prior to being admitted to the hospital for abdominal pain the patient had gone back to be seen at [facility] for diarrhea and had stool sample sent off a that time.  The results of these showed no parasites but did show an increased percentage of fecal fat. The patient C. Difficile was negative.  Patient reports that she has not had any further diarrhea except for one episode when she over ate around Fourth of July.  Current Outpatient Medications  Medication Sig Dispense Refill  . acetaminophen (TYLENOL) 160 MG/5ML suspension Take by mouth.    Marland Kitchen amitriptyline (ELAVIL) 25 MG tablet Take 25 mg by mouth at bedtime.    Marland Kitchen FLUoxetine (PROZAC) 10 MG tablet Take 10 mg by mouth daily.  3  . levothyroxine (SYNTHROID, LEVOTHROID) 50 MCG tablet Take 50 mcg by mouth daily before breakfast.    . loperamide (IMODIUM) 2 MG capsule Take 1 capsule (2 mg total) by  mouth as needed for diarrhea or loose stools. 30 capsule 0  . pantoprazole (PROTONIX) 40 MG tablet Take 1 tablet (40 mg total) by mouth 2 (two) times daily before a meal. 60 tablet 6  . traZODone (DESYREL) 50 MG tablet Take 50 mg by mouth 2 (two) times daily.    . cholestyramine (QUESTRAN) 4 g packet Take by mouth.     No current facility-administered medications for this visit.     Allergies as of 12/03/2017 - Review Complete 12/03/2017  Allergen Reaction Noted  . Codeine Nausea And Vomiting 03/06/2017    ROS:  General: Negative for anorexia, weight loss, fever, chills, fatigue, weakness. ENT: Negative for hoarseness, difficulty swallowing , nasal congestion. CV: Negative for chest pain, angina, palpitations, dyspnea on exertion, peripheral edema.  Respiratory: Negative for dyspnea at rest, dyspnea on exertion, cough, sputum, wheezing.  GI: See history of present illness. GU:  Negative for dysuria, hematuria, urinary incontinence, urinary frequency, nocturnal urination.  Endo: Negative for unusual weight change.    Physical Examination:   BP 103/60   Pulse 82   Ht 5\' 2"  (1.575 m)   Wt 129 lb 11.2 oz (58.8 kg)   BMI 23.72 kg/m   General: Well-nourished, well-developed in no acute distress.  Eyes: No icterus. Conjunctivae pink. Mouth: Oropharyngeal mucosa moist and pink , no lesions erythema or exudate. Lungs: Clear to auscultation bilaterally. Non-labored. Heart:  Regular rate and rhythm, no murmurs rubs or gallops.  Abdomen: Bowel sounds are normal, nontender, nondistended, no hepatosplenomegaly or masses, no abdominal bruits or hernia , no rebound or guarding.   Extremities: No lower extremity edema. No clubbing or deformities. Neuro: Alert and oriented x 3.  Grossly intact. Skin: Warm and dry, no jaundice.   Psych: Alert and cooperative, normal mood and affect.  Labs:    Imaging Studies: Mr 3d Recon At Scanner  Result Date: 11/16/2017 CLINICAL DATA:  67 year old  female with history of abnormal liver function tests. EXAM: MRI ABDOMEN WITHOUT AND WITH CONTRAST (INCLUDING MRCP) TECHNIQUE: Multiplanar multisequence MR imaging of the abdomen was performed both before and after the administration of intravenous contrast. Heavily T2-weighted images of the biliary and pancreatic ducts were obtained, and three-dimensional MRCP images were rendered by post processing. CONTRAST:  32mL MULTIHANCE GADOBENATE DIMEGLUMINE 529 MG/ML IV SOLN COMPARISON:  None. FINDINGS: Lower chest: Unremarkable. Hepatobiliary: No suspicious cystic or solid hepatic lesions. Status post cholecystectomy. Moderate intrahepatic and severe extrahepatic biliary ductal dilatation. Common hepatic duct measures 20 mm in diameter. Common bile duct measures 14 mm in diameter. No filling defect in the common bile duct to suggest choledocholithiasis. Abrupt transition in caliber of the distal common bile duct in the pancreatic head immediately before the ampulla. Pancreas: No pancreatic mass. MRCP images demonstrate diffuse pancreatic ductal dilatation measuring up to 5 mm. No pancreatic or peripancreatic fluid collections or inflammatory changes. Spleen:  Unremarkable. Adrenals/Urinary Tract: 1 cm T1 hypointense with some dependent T1 hyperintensity, mildly T2 hyperintense nonenhancing lesion in the interpolar region of the right kidney, compatible with a mildly complex cyst with some dependent proteinaceous/hemorrhagic debris. Left kidney and bilateral adrenal glands are normal in appearance. No hydroureteronephrosis in the visualized portions of the abdomen. Stomach/Bowel: Visualized portions are unremarkable. Vascular/Lymphatic: No aneurysm identified in the visualized abdominal vasculature. No lymphadenopathy noted in the abdomen. Other:  Trace amount of perisplenic ascites. Musculoskeletal: No aggressive appearing osseous lesions are noted in the visualized portions of the skeleton. IMPRESSION: 1. Moderate  intrahepatic and severe extrahepatic biliary ductal dilatation, as well as mild pancreatic ductal dilatation. Although no discrete pancreatic head lesion is confidently identified on today's examination, the possibility of a subtle ampullary lesion or other pancreatic lesion in the pancreatic head which is not detectable by MRI should be considered. Further evaluation with ERCP and endoscopic ultrasound should be considered in the near future. 2. No choledocholithiasis. 3. Status post cholecystectomy. 4. Trace amount of perisplenic ascites. Electronically Signed   By: Vinnie Langton M.D.   On: 11/16/2017 16:16   Dg C-arm 1-60 Min-no Report  Result Date: 11/17/2017 Fluoroscopy was utilized by the requesting physician.  No radiographic interpretation.   Mr Abdomen Mrcp Moise Boring Contast  Result Date: 11/16/2017 CLINICAL DATA:  67 year old female with history of abnormal liver function tests. EXAM: MRI ABDOMEN WITHOUT AND WITH CONTRAST (INCLUDING MRCP) TECHNIQUE: Multiplanar multisequence MR imaging of the abdomen was performed both before and after the administration of intravenous contrast. Heavily T2-weighted images of the biliary and pancreatic ducts were obtained, and three-dimensional MRCP images were rendered by post processing. CONTRAST:  68mL MULTIHANCE GADOBENATE DIMEGLUMINE 529 MG/ML IV SOLN COMPARISON:  None. FINDINGS: Lower chest: Unremarkable. Hepatobiliary: No suspicious cystic or solid hepatic lesions. Status post cholecystectomy. Moderate intrahepatic and severe extrahepatic biliary ductal dilatation. Common hepatic duct measures 20 mm in diameter. Common bile duct measures 14 mm in diameter. No filling defect in the common bile duct to suggest  choledocholithiasis. Abrupt transition in caliber of the distal common bile duct in the pancreatic head immediately before the ampulla. Pancreas: No pancreatic mass. MRCP images demonstrate diffuse pancreatic ductal dilatation measuring up to 5 mm. No  pancreatic or peripancreatic fluid collections or inflammatory changes. Spleen:  Unremarkable. Adrenals/Urinary Tract: 1 cm T1 hypointense with some dependent T1 hyperintensity, mildly T2 hyperintense nonenhancing lesion in the interpolar region of the right kidney, compatible with a mildly complex cyst with some dependent proteinaceous/hemorrhagic debris. Left kidney and bilateral adrenal glands are normal in appearance. No hydroureteronephrosis in the visualized portions of the abdomen. Stomach/Bowel: Visualized portions are unremarkable. Vascular/Lymphatic: No aneurysm identified in the visualized abdominal vasculature. No lymphadenopathy noted in the abdomen. Other:  Trace amount of perisplenic ascites. Musculoskeletal: No aggressive appearing osseous lesions are noted in the visualized portions of the skeleton. IMPRESSION: 1. Moderate intrahepatic and severe extrahepatic biliary ductal dilatation, as well as mild pancreatic ductal dilatation. Although no discrete pancreatic head lesion is confidently identified on today's examination, the possibility of a subtle ampullary lesion or other pancreatic lesion in the pancreatic head which is not detectable by MRI should be considered. Further evaluation with ERCP and endoscopic ultrasound should be considered in the near future. 2. No choledocholithiasis. 3. Status post cholecystectomy. 4. Trace amount of perisplenic ascites. Electronically Signed   By: Vinnie Langton M.D.   On: 11/16/2017 16:16   US Abdomen Limited Ruq  Result Date: 11/15/2017 CLINICAL DATA:  Elevated liver function tests. EXAM: ULTRASOUND ABDOMEN LIMITED RIGHT UPPER QUADRANT COMPARISON:  CT scan of June 10, 2017. Ultrasound of April 21, 2017. FINDINGS: Gallbladder: Status post cholecystectomy. Common bile duct: Diameter: 19 mm which is significantly dilated. Liver: No focal lesion identified. Within normal limits in parenchymal echogenicity. Portal vein is patent on color Doppler  imaging with normal direction of blood flow towards the liver. IMPRESSION: Status post cholecystectomy. Common bile duct is significantly dilated at 19 mm concerning for distal common bile duct obstruction. Correlation with liver function tests and MRCP is recommended. Electronically Signed   By: Marijo Conception, M.D.   On: 11/15/2017 21:30    Assessment and Plan:   Lacey Ramsey is a 67 y.o. y/o female who comes in today for follow-up after having an ERCP.  The patient had a dilated common bile duct and had a sphincterotomy with brushings.  The brushings did not show any malignant cells but did say that there was not a lot of tissue seen.  The patient will be set up for a repeat right upper quadrant ultrasound to see that the duct has returned to normal size.  The patient will have this done in approximately a month so that it has been given some time to shrink back down.  The patient has also been told that because there was excessive fat in the stool sample that if she were to have diarrhea again she may need to be tried on pancreatic enzymes for possible exocrine pancreatic insufficiency.  The patient denies any further diarrhea abdominal pain nausea vomiting.  The patient has been explained the plan and agrees with it.    Lucilla Lame, MD. Marval Regal   Note: This dictation was prepared with Dragon dictation along with smaller phrase technology. Any transcriptional errors that result from this process are unintentional.

## 2017-12-03 NOTE — Telephone Encounter (Signed)
Pt scheduled for a follow up with [facility] on 12/03/17.

## 2017-12-03 NOTE — Telephone Encounter (Signed)
-----   Message from Lucilla Lame, MD sent at 11/26/2017  8:52 AM EDT ----- Please have the patient come in for a follow up.

## 2017-12-08 ENCOUNTER — Ambulatory Visit
Admission: EM | Admit: 2017-12-08 | Discharge: 2017-12-08 | Disposition: A | Discharge status: Home / Self Care | Payer: Medicare Other | Attending: Family Medicine | Admitting: Family Medicine

## 2017-12-08 ENCOUNTER — Other Ambulatory Visit: Payer: Self-pay

## 2017-12-08 DIAGNOSIS — T22212A Burn of second degree of left forearm, initial encounter: Secondary | ICD-10-CM

## 2017-12-08 DIAGNOSIS — T23272A Burn of second degree of left wrist, initial encounter: Secondary | ICD-10-CM

## 2017-12-08 DIAGNOSIS — T23131A Burn of first degree of multiple right fingers (nail), not including thumb, initial encounter: Secondary | ICD-10-CM

## 2017-12-08 DIAGNOSIS — T22112A Burn of first degree of left forearm, initial encounter: Secondary | ICD-10-CM

## 2017-12-08 DIAGNOSIS — X12XXXA Contact with other hot fluids, initial encounter: Secondary | ICD-10-CM

## 2017-12-08 DIAGNOSIS — Z23 Encounter for immunization: Secondary | ICD-10-CM | POA: Diagnosis not present

## 2017-12-08 MED ORDER — TETANUS-DIPHTH-ACELL PERTUSSIS 5-2.5-18.5 LF-MCG/0.5 IM SUSP
0.5000 mL | Freq: Once | INTRAMUSCULAR | Status: AC
  Administered 2017-12-08: 0.5 mL via INTRAMUSCULAR

## 2017-12-08 MED ORDER — SILVER SULFADIAZINE 1 % EX CREA: 1.0000 "application " | TOPICAL_CREAM | Freq: Two times a day (BID) | CUTANEOUS | 0 refills | Status: DC

## 2017-12-08 NOTE — ED Provider Notes (Signed)
[facility]    CSN: [id number] Arrival date & time: 12/08/17  0950     History   Chief Complaint Chief Complaint  Patient presents with  . Burn    HPI Alba Perillo is a 67 y.o. female.   67 yo female with a c/o burn to left wrist and forearm and index and middle finger on right right hand after spilling some boiling water last night. States she rinsed in cold water. This morning she noticed blister on left wrist area. Denies any fevers or chills. Has been taking tylenol for pain.   The history is provided by the patient.    Past Medical History:  Diagnosis Date  . Anxiety   . GERD (gastroesophageal reflux disease)   . Hypothyroidism   . IBS (irritable bowel syndrome)   . Insomnia     Patient Active Problem List   Diagnosis Date Noted  . Common bile duct (CBD) obstruction   . Other specified diseases of biliary tract   . Abdominal pain 11/15/2017  . 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 Gallbladder mass   . Anxiety, generalized 03/24/2017  . Hypothyroidism, unspecified 03/24/2017  . Irritable bowel syndrome with diarrhea 03/24/2017  . Other insomnia 03/24/2017  . 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 Gastroesophageal reflux disease without esophagitis 03/20/2017  . Hyperlipidemia, mixed 03/20/2017    Past Surgical History:  Procedure Laterality Date  . APPENDECTOMY    . CHOLECYSTECTOMY N/A 05/13/2017   Procedure: LAPAROSCOPIC CHOLECYSTECTOMY;  Surgeon: Olean Ree, MD;  Location: [facility] ORS;  Service: General;  Laterality: N/A;  . ERCP N/A 11/17/2017   Procedure: ENDOSCOPIC RETROGRADE CHOLANGIOPANCREATOGRAPHY (ERCP);  Surgeon: Lucilla Lame, MD;  Location: [facility] ENDOSCOPY;  Service: Endoscopy;  Laterality: N/A;  . HERNIA REPAIR    . KNEE ARTHROSCOPY    . TONSILLECTOMY    . TUBAL LIGATION      OB History   None      Home Medications    Prior to Admission medications   Medication Sig Start Date End Date Taking? Authorizing Provider    acetaminophen (TYLENOL) 160 MG/5ML suspension Take by mouth. 07/30/17  Yes [provider]  amitriptyline (ELAVIL) 25 MG tablet Take 25 mg by mouth at bedtime.   Yes [provider]  FLUoxetine (PROZAC) 10 MG tablet Take 10 mg by mouth daily. 10/14/17  Yes [provider]  levothyroxine (SYNTHROID, LEVOTHROID) 50 MCG tablet Take 50 mcg by mouth daily before breakfast.   Yes [provider]  loperamide (IMODIUM) 2 MG capsule Take 1 capsule (2 mg total) by mouth as needed for diarrhea or loose stools. 11/18/17  Yes Salary, Avel Peace, MD  pantoprazole (PROTONIX) 40 MG tablet Take 1 tablet (40 mg total) by mouth 2 (two) times daily before a meal. 06/13/17  Yes Clayburn Pert, MD  traZODone (DESYREL) 50 MG tablet Take 50 mg by mouth 2 (two) times daily.   Yes [provider]  cholestyramine (QUESTRAN) 4 g packet Take by mouth. 10/06/17 10/06/18  [provider]  silver sulfADIAZINE (SILVADENE) 1 % cream Apply 1 application topically 2 (two) times daily. 12/08/17   Norval Gable, MD    Family History Family History  Problem Relation Age of Onset  . Cancer Mother   . Cancer Sister     Social History Social History   Tobacco Use  . Smoking status: Never Smoker  . Smokeless tobacco: Never Used  Substance Use Topics  . Alcohol use: Yes    Comment: rare   .  Drug use: No     Allergies   Codeine   Review of Systems Review of Systems   Physical Exam Triage Vital Signs ED Triage Vitals  Enc Vitals Group     BP 12/08/17 1015 (!) 141/78     Pulse Rate 12/08/17 1015 71     Resp 12/08/17 1015 18     Temp 12/08/17 1015 98.3 F (36.8 C)     Temp Source 12/08/17 1015 Oral     SpO2 12/08/17 1015 100 %     Weight 12/08/17 1012 129 lb (58.5 kg)     Height 12/08/17 1012 5\' 2"  (1.575 m)     Head Circumference --      Peak Flow --      Pain Score 12/08/17 1012 6     Pain Loc --      Pain Edu? --      Excl. in [place]? --    No data  found.  Updated Vital Signs BP (!) 141/78 (BP Location: Left Arm)   Pulse 71   Temp 98.3 F (36.8 C) (Oral)   Resp 18   Ht 5\' 2"  (1.575 m)   Wt 129 lb (58.5 kg)   SpO2 100%   BMI 23.59 kg/m   Visual Acuity Right Eye Distance:   Left Eye Distance:   Bilateral Distance:    Right Eye Near:   Left Eye Near:    Bilateral Near:     Physical Exam  Constitutional: She appears well-developed and well-nourished. No distress.  Musculoskeletal:       Left forearm: She exhibits tenderness (3.5cmx1.5cm blister on palmar area of wrist skin; erythema to ventral aspect of forearm; non-circumferential). She exhibits no bony tenderness and no laceration.       Right hand: She exhibits tenderness (mild to dorsal aspect of distal index and middle finger with erythema; no blisters noted;  non-circumferential). She exhibits normal range of motion. Normal sensation noted. Normal strength noted.  Skin: She is not diaphoretic.  Nursing note and vitals reviewed.    UC Treatments / Results  Labs (all labs ordered are listed, but only abnormal results are displayed) Labs Reviewed - No data to display  EKG None  Radiology No results found.  Procedures Procedures (including critical care time)  Medications Ordered in UC Medications  Tdap (BOOSTRIX) injection 0.5 mL (0.5 mLs Intramuscular Given 12/08/17 1056)    Initial Impression / Assessment and Plan / UC Course  I have reviewed the triage vital signs and the nursing notes.  Pertinent labs & imaging results that were available during my care of the patient were reviewed by me and considered in my medical decision making (see chart for details).      Final Clinical Impressions(s) / UC Diagnoses   Final diagnoses:  Partial thickness burn of left forearm, initial encounter  (1st degree burns to right index, right middle finger and left forearm) (2nd degree burn to left wrist area)   Discharge Instructions   None    ED  Prescriptions    Medication Sig Dispense Auth. Provider   silver sulfADIAZINE (SILVADENE) 1 % cream Apply 1 application topically 2 (two) times daily. 50 g Norval Gable, MD     1. diagnosis reviewed with patient; area cleaned and dressed  2. Patient given Tdap 3. rx as per orders above; reviewed possible side effects, interactions, risks and benefits  4. Recommend supportive treatment with burn wound care (verbal and written information given) 5. Follow-up at  wound care clinic this week  Controlled Substance Prescriptions [place] Controlled Substance Registry consulted? Not Applicable   Norval Gable, MD 12/08/17 1840

## 2017-12-08 NOTE — ED Triage Notes (Signed)
Patient has a burn to left forearm, wrist, and two fingers on right fingers. Patient states that she was boiling water last night and it spilled on her arm and fingers. Patient states that she wrapped the area last night and this morning area is blistered and painful. Patient reports that she is set to leave town tomorrow.

## 2017-12-17 ENCOUNTER — Encounter: Payer: Medicare Other | Attending: Internal Medicine | Admitting: Internal Medicine

## 2017-12-17 DIAGNOSIS — F419 Anxiety disorder, unspecified: Secondary | ICD-10-CM | POA: Diagnosis not present

## 2017-12-17 DIAGNOSIS — T22112A Burn of first degree of left forearm, initial encounter: Secondary | ICD-10-CM | POA: Insufficient documentation

## 2017-12-17 DIAGNOSIS — G4700 Insomnia, unspecified: Secondary | ICD-10-CM | POA: Insufficient documentation

## 2017-12-17 DIAGNOSIS — E039 Hypothyroidism, unspecified: Secondary | ICD-10-CM | POA: Diagnosis not present

## 2017-12-17 DIAGNOSIS — T23272A Burn of second degree of left wrist, initial encounter: Secondary | ICD-10-CM | POA: Insufficient documentation

## 2017-12-17 DIAGNOSIS — X118XXA Contact with other hot tap-water, initial encounter: Secondary | ICD-10-CM | POA: Insufficient documentation

## 2017-12-17 DIAGNOSIS — K219 Gastro-esophageal reflux disease without esophagitis: Secondary | ICD-10-CM | POA: Diagnosis not present

## 2017-12-17 DIAGNOSIS — K589 Irritable bowel syndrome without diarrhea: Secondary | ICD-10-CM | POA: Diagnosis not present

## 2017-12-17 DIAGNOSIS — E782 Mixed hyperlipidemia: Secondary | ICD-10-CM | POA: Diagnosis not present

## 2017-12-17 DIAGNOSIS — T23131A Burn of first degree of multiple right fingers (nail), not including thumb, initial encounter: Secondary | ICD-10-CM | POA: Insufficient documentation

## 2017-12-17 DIAGNOSIS — T23221A Burn of second degree of single right finger (nail) except thumb, initial encounter: Secondary | ICD-10-CM | POA: Diagnosis not present

## 2017-12-31 ENCOUNTER — Ambulatory Visit: Payer: Medicare Other | Admitting: Internal Medicine

## 2017-12-31 ENCOUNTER — Ambulatory Visit
Admission: RE | Admit: 2017-12-31 | Discharge: 2017-12-31 | Disposition: A | Discharge status: Home / Self Care | Payer: Medicare Other | Source: Ambulatory Visit | Attending: Gastroenterology | Admitting: Gastroenterology

## 2017-12-31 DIAGNOSIS — Z9049 Acquired absence of other specified parts of digestive tract: Secondary | ICD-10-CM | POA: Insufficient documentation

## 2017-12-31 DIAGNOSIS — K838 Other specified diseases of biliary tract: Secondary | ICD-10-CM | POA: Insufficient documentation

## 2018-01-01 DIAGNOSIS — Z0000 Encounter for general adult medical examination without abnormal findings: Secondary | ICD-10-CM | POA: Diagnosis not present

## 2018-01-04 NOTE — Progress Notes (Signed)
[name], [name] ([id number]) Visit Report for 12/17/2017 Allergy List Details Patient Name: Lacey Ramsey, Lacey Ramsey Date of Service: 12/17/2017 12:30 PM Medical Record Number: [id number] Patient Account Number: [id number] Date of Birth/Sex: 29-Jul-1950 (67 y.o. F) Treating RN: Cornell Barman Primary Care Emalyn Schou: Barbaraann Boys Other Clinician: Referring Jayln Branscom: Norval Gable Treating Gene Colee/Extender: Tito Dine in Treatment: 0 Allergies Active Allergies No Known Drug Allergies Allergy Notes Electronic Signature(s) Signed: 12/23/2017 12:56:03 PM By: Gretta Cool, BSN, RN, CWS, Kim RN, BSN Entered By: Gretta Cool, BSN, RN, CWS, Kim on 12/23/2017 12:56:03 Lacey Ramsey ([id number]) -------------------------------------------------------------------------------- Arrival Information Details Patient Name: Lacey Ramsey Date of Service: 12/17/2017 12:30 PM Medical Record Number: [id number] Patient Account Number: [id number] Date of Birth/Sex: 08/31/1950 (67 y.o. F) Treating RN: Cornell Barman Primary Care Steuben Cohick: Barbaraann Boys Other Clinician: Referring Yoandri Congrove: Norval Gable Treating Welborn Keena/Extender: Tito Dine in Treatment: 0 Visit Information Patient Arrived: Ambulatory Arrival Time: 12:40 Accompanied By: self Transfer Assistance: None Patient Identification Verified: Yes Secondary Verification Process Completed: Yes Patient Requires Transmission-Based No Precautions: Electronic Signature(s) Signed: 12/23/2017 8:25:22 AM By: Gretta Cool, BSN, RN, CWS, Kim RN, BSN Entered By: Gretta Cool, BSN, RN, CWS, Kim on 12/23/2017 08:25:22 Lacey Ramsey ([id number]) -------------------------------------------------------------------------------- Clinic Level of Care Assessment Details Patient Name: Lacey Ramsey Date of Service: 12/17/2017 12:30 PM Medical Record Number: [id number] Patient Account Number: [id number] Date of Birth/Sex: 01-07-1951 (67 y.o. F) Treating RN: Cornell Barman Primary Care Maecyn Panning: Barbaraann Boys Other Clinician: Referring Yardley Beltran: Norval Gable Treating Tycho Cheramie/Extender: Tito Dine in Treatment: 0 Clinic Level of Care Assessment Items TOOL 2 Quantity Score []  - Use when only an EandM is performed on the INITIAL visit 0 ASSESSMENTS - Nursing Assessment / Reassessment X - General Physical Exam (combine w/ comprehensive assessment (listed just below) when 1 20 performed on new pt. evals) X- 1 25 Comprehensive Assessment (HX, ROS, Risk Assessments, Wounds Hx, etc.) ASSESSMENTS - Wound and Skin Assessment / Reassessment X - Simple Wound Assessment / Reassessment - one wound 1 5 []  - 0 Complex Wound Assessment / Reassessment - multiple wounds []  - 0 Dermatologic / Skin Assessment (not related to wound area) ASSESSMENTS - Ostomy and/or Continence Assessment and Care []  - Incontinence Assessment and Management 0 []  - 0 Ostomy Care Assessment and Management (repouching, etc.) PROCESS - Coordination of Care []  - Simple Patient / Family Education for ongoing care 0 []  - 0 Complex (extensive) Patient / Family Education for ongoing care []  - 0 Staff obtains Programmer, systems, Records, Test Results / Process Orders []  - 0 Staff telephones HHA, Nursing Homes / Clarify orders / etc []  - 0 Routine Transfer to another Facility (non-emergent condition) []  - 0 Routine Hospital Admission (non-emergent condition) X- 1 15 New Admissions / Biomedical engineer / Ordering NPWT, Apligraf, etc. []  - 0 Emergency Hospital Admission (emergent condition) X- 1 10 Simple Discharge Coordination []  - 0 Complex (extensive) Discharge Coordination PROCESS - Special Needs []  - Pediatric / Minor Patient Management 0 []  - 0 Isolation Patient Management Lacey Ramsey ([id number]) []  - 0 Hearing / Language / Visual special needs []  - 0 Assessment of Community assistance (transportation, D/C planning, etc.) []  - 0 Additional assistance /  Altered mentation []  - 0 Support Surface(s) Assessment (bed, cushion, seat, etc.) INTERVENTIONS - Wound Cleansing / Measurement X - Wound Imaging (photographs - any number of wounds) 1 5 []  - 0 Wound Tracing (instead of photographs) X- 1 5 Simple Wound Measurement - one wound []  - 0 Complex Wound Measurement - multiple wounds  X- 1 5 Simple Wound Cleansing - one wound []  - 0 Complex Wound Cleansing - multiple wounds INTERVENTIONS - Wound Dressings []  - Small Wound Dressing one or multiple wounds 0 X- 1 15 Medium Wound Dressing one or multiple wounds []  - 0 Large Wound Dressing one or multiple wounds []  - 0 Application of Medications - injection INTERVENTIONS - Miscellaneous []  - External ear exam 0 []  - 0 Specimen Collection (cultures, biopsies, blood, body fluids, etc.) []  - 0 Specimen(s) / Culture(s) sent or taken to Lab for analysis []  - 0 Patient Transfer (multiple staff / Harrel Lemon Lift / Similar devices) []  - 0 Simple Staple / Suture removal (25 or less) []  - 0 Complex Staple / Suture removal (26 or more) []  - 0 Hypo / Hyperglycemic Management (close monitor of Blood Glucose) []  - 0 Ankle / Brachial Index (ABI) - do not check if billed separately Has the patient been seen at the hospital within the last three years: Yes Total Score: 105 Level Of Care: New/Established - Level 3 Electronic Signature(s) Signed: 12/26/2017 6:17:48 PM By: Gretta Cool, BSN, RN, CWS, Kim RN, BSN Entered By: Gretta Cool, BSN, RN, CWS, Kim on 12/23/2017 13:01:48 Lacey Ramsey ([id number]) -------------------------------------------------------------------------------- Encounter Discharge Information Details Patient Name: Lacey Ramsey Date of Service: 12/17/2017 12:30 PM Medical Record Number: [id number] Patient Account Number: [id number] Date of Birth/Sex: [birth year] (67 y.o. F) Treating RN: Cornell Barman Primary Care Shauntae Reitman: Barbaraann Boys Other Clinician: Referring Lavilla Delamora: Norval Gable Treating Sheree Lalla/Extender: Tito Dine in Treatment: 0 Encounter Discharge Information Items Discharge Condition: Stable Ambulatory Status: Ambulatory Discharge Destination: Home Transportation: Private Auto Accompanied By: self Schedule Follow-up Appointment: Yes Clinical Summary of Care: Electronic Signature(s) Signed: 12/23/2017 1:02:55 PM By: Gretta Cool, BSN, RN, CWS, Kim RN, BSN Entered By: Gretta Cool, BSN, RN, CWS, Kim on 12/23/2017 13:02:55 Lacey Ramsey ([id number]) -------------------------------------------------------------------------------- Lower Extremity Assessment Details Patient Name: Lacey Ramsey Date of Service: 12/17/2017 12:30 PM Medical Record Number: [id number] Patient Account Number: [id number] Date of Birth/Sex: 02-27-1951 (67 y.o. F) Treating RN: Cornell Barman Primary Care Kashden Deboy: Barbaraann Boys Other Clinician: Referring Vong Garringer: Norval Gable Treating Vardaan Depascale/Extender: Tito Dine in Treatment: 0 Electronic Signature(s) Signed: 12/23/2017 12:55:41 PM By: Gretta Cool, BSN, RN, CWS, Kim RN, BSN Entered By: Gretta Cool, BSN, RN, CWS, Kim on 12/23/2017 12:55:41 Lacey Ramsey ([id number]) -------------------------------------------------------------------------------- Multi Wound Chart Details Patient Name: Lacey Ramsey Date of Service: 12/17/2017 12:30 PM Medical Record Number: [id number] Patient Account Number: [id number] Date of Birth/Sex: 11/11/1950 (67 y.o. F) Treating RN: Cornell Barman Primary Care Shenandoah Vandergriff: Barbaraann Boys Other Clinician: Referring Admir Candelas: Norval Gable Treating Idolina Mantell/Extender: Tito Dine in Treatment: 0 Vital Signs Height(in): 62 Pulse(bpm): 63 Weight(lbs): 132 Blood Pressure(mmHg): 126/59 Body Mass Index(BMI): 24 Temperature(F): 98.2 Respiratory Rate 18 (breaths/min): Photos: [N/A:N/A] Wound Location: Right Hand - 3rd Digit N/A N/A Wounding Event: Thermal Burn N/A  N/A Primary Etiology: 2nd degree Burn N/A N/A Date Acquired: 12/07/2017 N/A N/A Weeks of Treatment: 0 N/A N/A Wound Status: Open N/A N/A Measurements L x W x D 2.3x0.6x0.1 N/A N/A (cm) Area (cm) : 1.084 N/A N/A Volume (cm) : 0.108 N/A N/A Classification: Partial Thickness N/A N/A Exudate Amount: Small N/A N/A Exudate Type: Serous N/A N/A Exudate Color: amber N/A N/A Wound Margin: Flat and Intact N/A N/A Granulation Amount: Small (1-33%) N/A N/A Necrotic Amount: None Present (0%) N/A N/A Epithelialization: Large (67-100%) N/A N/A Periwound Skin Texture: No Abnormalities Noted N/A N/A Periwound Skin Moisture: No Abnormalities Noted N/A N/A Periwound Skin Color: No Abnormalities  Noted N/A N/A Tenderness on Palpation: No N/A N/A Treatment Notes Wound #1 (Right Hand - 3rd Digit) 1. Cleansed with: Clean wound with Normal Saline 2. Anesthetic AARIAH, GODETTE ([id number]) Topical Lidocaine 4% cream to wound bed prior to debridement 4. Dressing Applied: Other dressing (specify in notes) 5. Secondary Dressing Applied Kerlix/Conform Notes Silvadene cream Electronic Signature(s) Signed: 12/24/2017 8:05:08 AM By: Linton Ham MD Entered By: Linton Ham on 12/23/2017 19:51:20 Lacey Ramsey ([id number]) -------------------------------------------------------------------------------- Pain Assessment Details Patient Name: Lacey Ramsey Date of Service: 12/17/2017 12:30 PM Medical Record Number: [id number] Patient Account Number: [id number] Date of Birth/Sex: 09-26-1950 (67 y.o. F) Treating RN: Cornell Barman Primary Care Nahara Dona: Barbaraann Boys Other Clinician: Referring Jacaden Forbush: Norval Gable Treating Mery Guadalupe/Extender: Tito Dine in Treatment: 0 Active Problems Location of Pain Severity and Description of Pain Patient Has Paino No Site Locations Pain Management and Medication Current Pain Management: Electronic Signature(s) Signed: 12/23/2017  8:25:29 AM By: Gretta Cool, BSN, RN, CWS, Kim RN, BSN Entered By: Gretta Cool, BSN, RN, CWS, Kim on 12/23/2017 08:25:29 Lacey Ramsey ([id number]) -------------------------------------------------------------------------------- Patient/Caregiver Education Details Patient Name: Lacey Ramsey Date of Service: 12/17/2017 12:30 PM Medical Record Number: [id number] Patient Account Number: [id number] Date of Birth/Gender: May 26, 1951 (67 y.o. F) Treating RN: Cornell Barman Primary Care Physician: Barbaraann Boys Other Clinician: Referring Physician: Norval Gable Treating Physician/Extender: Tito Dine in Treatment: 0 Education Assessment Education Provided To: Patient Education Topics Provided Safety: Handouts: Safety Methods: Demonstration, Explain/Verbal Responses: State content correctly Wound/Skin Impairment: Handouts: Caring for Your Ulcer Methods: Demonstration, Explain/Verbal Responses: State content correctly Electronic Signature(s) Signed: 12/26/2017 6:17:48 PM By: Gretta Cool, BSN, RN, CWS, Kim RN, BSN Entered By: Gretta Cool, BSN, RN, CWS, Kim on 12/23/2017 13:03:33 Lacey Ramsey ([id number]) -------------------------------------------------------------------------------- Wound Assessment Details Patient Name: Lacey Ramsey Date of Service: 12/17/2017 12:30 PM Medical Record Number: [id number] Patient Account Number: [id number] Date of Birth/Sex: [birth year] (67 y.o. F) Treating RN: Cornell Barman Primary Care Aalani Aikens: Barbaraann Boys Other Clinician: Referring November Sypher: Norval Gable Treating Jerrard Bradburn/Extender: Tito Dine in Treatment: 0 Wound Status Wound Number: 1 Primary Etiology: 2nd degree Burn Wound Location: Right Hand - 3rd Digit Wound Status: Open Wounding Event: Thermal Burn Date Acquired: 12/07/2017 Weeks Of Treatment: 0 Clustered Wound: No Photos Photo Uploaded By: Sharon Mt on 12/23/2017 13:14:26 Wound Measurements Length: (cm) 2.3 Width:  (cm) 0.6 Depth: (cm) 0.1 Area: (cm) 1.084 Volume: (cm) 0.108 % Reduction in Area: % Reduction in Volume: Epithelialization: Large (67-100%) Undermining: No Wound Description Classification: Partial Thickness Wound Margin: Flat and Intact Exudate Amount: Small Exudate Type: Serous Exudate Color: amber Foul Odor After Cleansing: No Slough/Fibrino No Wound Bed Granulation Amount: Small (1-33%) Necrotic Amount: None Present (0%) Periwound Skin Texture Texture Color No Abnormalities Noted: No No Abnormalities Noted: No Moisture No Abnormalities Noted: No STRAUS, Hattye ([id number]) Treatment Notes Wound #1 (Right Hand - 3rd Digit) 1. Cleansed with: Clean wound with Normal Saline 2. Anesthetic Topical Lidocaine 4% cream to wound bed prior to debridement 4. Dressing Applied: Other dressing (specify in notes) 5. Secondary Dressing Applied Kerlix/Conform Notes Silvadene cream Electronic Signature(s) Signed: 12/23/2017 12:55:32 PM By: Gretta Cool, BSN, RN, CWS, Kim RN, BSN Entered By: Gretta Cool, BSN, RN, CWS, Kim on 12/23/2017 12:55:32 Lacey Ramsey ([id number]) -------------------------------------------------------------------------------- Vitals Details Patient Name: Lacey Ramsey Date of Service: 12/17/2017 12:30 PM Medical Record Number: [id number] Patient Account Number: [id number] Date of Birth/Sex: 1951/01/05 (67 y.o. F) Treating RN: Cornell Barman Primary Care Vernel Donlan: Barbaraann Boys Other Clinician: Referring Nara Paternoster: Norval Gable Treating Aadhira Heffernan/Extender: Tito Dine  in Treatment: 0 Vital Signs Time Taken: 12:45 Temperature (F): 98.2 Height (in): 62 Pulse (bpm): 63 Weight (lbs): 132 Respiratory Rate (breaths/min): 18 Body Mass Index (BMI): 24.1 Blood Pressure (mmHg): 126/59 Reference Range: 80 - 120 mg / dl Electronic Signature(s) Signed: 12/23/2017 8:26:08 AM By: Gretta Cool, BSN, RN, CWS, Kim RN, BSN Entered By: Gretta Cool, BSN, RN, CWS, Kim on  12/23/2017 23:34:35
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
## 2018-01-15 ENCOUNTER — Telehealth: Payer: Self-pay | Admitting: Gastroenterology

## 2018-01-15 ENCOUNTER — Other Ambulatory Visit: Payer: Self-pay

## 2018-01-15 MED ORDER — CHOLESTYRAMINE 4 G PO PACK
4.0000 g | PACK | Freq: Two times a day (BID) | ORAL | 6 refills | Status: DC
Start: 2018-01-15 — End: 2019-08-18

## 2018-01-15 NOTE — Telephone Encounter (Signed)
PT left vm she states she knows Dr. Allen Norris is out of the country but I had a u/s to check my bowel ducts and I really need the results of that please call

## 2018-01-15 NOTE — Telephone Encounter (Signed)
Pt notified of results. Pt has requested a refill on her Cholestyramine as her diarrhea has returned.

## 2018-01-15 NOTE — Telephone Encounter (Signed)
Dr. Vicente Males, could you please review pt's results of her [place] dated 12/31/17. Pt is aware Dr. Allen Norris is out of the office but she has requested that another physician review and advise.

## 2018-01-15 NOTE — Telephone Encounter (Signed)
Inform - the ultrasound shows that the bile duct dilation in the liver has resolved. Suggest she follow up with Dr Allen Norris in the office in a few weeks. Appears that the ERCP has helped.

## 2018-02-17 DIAGNOSIS — Z23 Encounter for immunization: Secondary | ICD-10-CM | POA: Diagnosis not present

## 2018-03-04 ENCOUNTER — Other Ambulatory Visit: Payer: Self-pay

## 2018-03-05 ENCOUNTER — Other Ambulatory Visit: Payer: Self-pay

## 2018-03-05 DIAGNOSIS — K838 Other specified diseases of biliary tract: Secondary | ICD-10-CM

## 2018-03-05 DIAGNOSIS — R197 Diarrhea, unspecified: Secondary | ICD-10-CM

## 2018-03-11 DIAGNOSIS — R197 Diarrhea, unspecified: Secondary | ICD-10-CM | POA: Diagnosis not present

## 2018-03-12 LAB — COMPREHENSIVE METABOLIC PANEL
ALBUMIN: 4.1 g/dL (ref 3.6–4.8)
ALK PHOS: 95 IU/L (ref 39–117)
ALT: 28 IU/L (ref 0–32)
AST: 28 IU/L (ref 0–40)
Albumin/Globulin Ratio: 2.1 (ref 1.2–2.2)
BUN/Creatinine Ratio: 9 — ABNORMAL LOW (ref 12–28)
BUN: 7 mg/dL — AB (ref 8–27)
Bilirubin Total: 0.4 mg/dL (ref 0.0–1.2)
CO2: 23 mmol/L (ref 20–29)
CREATININE: 0.82 mg/dL (ref 0.57–1.00)
Calcium: 9.4 mg/dL (ref 8.7–10.3)
Chloride: 103 mmol/L (ref 96–106)
GFR calc Af Amer: 85 mL/min/{1.73_m2} (ref >59)
GFR, EST NON AFRICAN AMERICAN: 74 mL/min/{1.73_m2} (ref >59)
Globulin, Total: 2 g/dL (ref 1.5–4.5)
Glucose: 87 mg/dL (ref 65–99)
Potassium: 3.7 mmol/L (ref 3.5–5.2)
SODIUM: 142 mmol/L (ref 134–144)
TOTAL PROTEIN: 6.1 g/dL (ref 6.0–8.5)

## 2018-03-13 ENCOUNTER — Telehealth: Payer: Self-pay | Admitting: Gastroenterology

## 2018-03-13 NOTE — Telephone Encounter (Signed)
Pt left vm for Lab results °

## 2018-03-16 NOTE — Telephone Encounter (Signed)
Pt had her liver enzymes recheck at a [facility] in [place] because she was having some abdominal discomfort and was worried her common bile duct was dilated again. Please review results.

## 2018-03-16 NOTE — Telephone Encounter (Signed)
Pt.notified

## 2018-03-16 NOTE — Telephone Encounter (Signed)
They were normal 5 days ago.

## 2018-04-09 DIAGNOSIS — K580 Irritable bowel syndrome with diarrhea: Secondary | ICD-10-CM | POA: Diagnosis not present

## 2018-04-09 DIAGNOSIS — F5109 Other insomnia not due to a substance or known physiological condition: Secondary | ICD-10-CM | POA: Diagnosis not present

## 2018-04-09 DIAGNOSIS — K589 Irritable bowel syndrome without diarrhea: Secondary | ICD-10-CM | POA: Diagnosis not present

## 2018-04-09 DIAGNOSIS — Z1239 Encounter for other screening for malignant neoplasm of breast: Secondary | ICD-10-CM | POA: Diagnosis not present

## 2018-04-09 DIAGNOSIS — F411 Generalized anxiety disorder: Secondary | ICD-10-CM | POA: Diagnosis not present

## 2018-04-09 DIAGNOSIS — Z23 Encounter for immunization: Secondary | ICD-10-CM | POA: Diagnosis not present

## 2018-04-09 DIAGNOSIS — E039 Hypothyroidism, unspecified: Secondary | ICD-10-CM | POA: Diagnosis not present

## 2018-04-09 DIAGNOSIS — K219 Gastro-esophageal reflux disease without esophagitis: Secondary | ICD-10-CM | POA: Diagnosis not present

## 2018-04-27 ENCOUNTER — Encounter (INDEPENDENT_AMBULATORY_CARE_PROVIDER_SITE_OTHER): Payer: Self-pay | Admitting: Internal Medicine

## 2018-04-27 ENCOUNTER — Encounter (INDEPENDENT_AMBULATORY_CARE_PROVIDER_SITE_OTHER): Payer: Self-pay | Admitting: *Deleted

## 2018-04-27 ENCOUNTER — Ambulatory Visit (INDEPENDENT_AMBULATORY_CARE_PROVIDER_SITE_OTHER): Payer: Medicare Other | Admitting: Internal Medicine

## 2018-04-27 DIAGNOSIS — R1010 Upper abdominal pain, unspecified: Secondary | ICD-10-CM | POA: Diagnosis not present

## 2018-04-27 NOTE — Progress Notes (Signed)
   Subjective:    Patient ID: Lacey Ramsey, female    DOB: April 17, 1951, 67 y.o.   MRN: [id number]  HPI Referred by Dr. Haynes Hoehn for IBS with diarrhea. Previous patient of Dr. Allen Norris at [facility]. . Laparoscopic cholecystectomy 05/13/2017 (gallbladder mass). She also had a hiatal hernia repair in 2019 (laproscopic robot assisted laparoscopy surgical repair of paraesohageal hernia, includes fundoplasty when performed with implantation of mesh (07/29/2017) at [facility] Dr. [place].  She underwent an ERCP in June of this year by Dr. 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 Stricture was benign appearing. The entire main bile duct was dilated. A biliary sphincterotomy was performed.  She has a lot of flatus. She says she has diarrhea off and on. She is having diarrhea on and off once a week. Cholestyramine does control the diarrhea. She does get constipated with the Cholestyramine. She has been having the diarrhea for years. Hx of IBS. Her last colonoscopy was 3 yrs ago in [facility].  She also c/o left upper abdominal pain at times which comes and goes.  Sometimes occurs after she eats.  Stool studies have been negative for c diff and calprotein (fecal), Gi pathogen.   MRI in June of this year revealed: IMPRESSION: 1. Moderate intrahepatic and severe extrahepatic biliary ductal dilatation, as well as mild pancreatic ductal dilatation. Although no discrete pancreatic head lesion is confidently identified on today's examination, the possibility of a subtle ampullary lesion or other pancreatic lesion in the pancreatic head which is not detectable by MRI should be considered. Further evaluation with ERCP and endoscopic ultrasound should be considered in the near future. 2. No choledocholithiasis. 3. Status post cholecystectomy. 4. Trace amount of perisplenic ascites.   DG esphgram: 06/11/2017 IMPRESSION: Large hiatal hernia with much of the stomach lying within the thoracic cavity. The GE junction  lies along the inferior aspect of the intrathoracic portion of the stomach. Emptying of the hernia into the intraabdominal portion of the stomach did not appear restricted. Gastric emptying into the duodenum was prompt.    Review of Systems     Objective:   Physical Exam Blood pressure 130/82, pulse 72, temperature (!) 97.5 F (36.4 C), height 5\' 2"  (1.575 m), weight 129 lb 11.2 oz (58.8 kg). Alert and oriented. Skin warm and dry. Oral mucosa is moist.   . Sclera anicteric, conjunctivae is pink. Thyroid not enlarged. No cervical lymphadenopathy. Lungs clear. Heart regular rate and rhythm.  Abdomen is soft. Bowel sounds are positive. No hepatomegaly. No abdominal masses felt. No tenderness.  No edema to lower extremities.          Assessment & Plan:  Upper abdominal pain. Am going to get a CT abdomen/pelvis with CM. CMET. Stool studies this year were normal. Further recommendations to follow.

## 2018-04-27 NOTE — Patient Instructions (Signed)
CT abdomen/pelvis with esophagus.

## 2018-04-28 LAB — COMPREHENSIVE METABOLIC PANEL
AG Ratio: 1.7 (calc) (ref 1.0–2.5)
ALKALINE PHOSPHATASE (APISO): 79 U/L (ref 33–130)
ALT: 14 U/L (ref 6–29)
AST: 22 U/L (ref 10–35)
Albumin: 4 g/dL (ref 3.6–5.1)
BUN: 10 mg/dL (ref 7–25)
CO2: 31 mmol/L (ref 20–32)
CREATININE: 0.89 mg/dL (ref 0.50–0.99)
Calcium: 9.5 mg/dL (ref 8.6–10.4)
Chloride: 106 mmol/L (ref 98–110)
GLUCOSE: 87 mg/dL (ref 65–139)
Globulin: 2.3 g/dL (calc) (ref 1.9–3.7)
Potassium: 3.7 mmol/L (ref 3.5–5.3)
Sodium: 143 mmol/L (ref 135–146)
Total Bilirubin: 0.5 mg/dL (ref 0.2–1.2)
Total Protein: 6.3 g/dL (ref 6.1–8.1)

## 2018-05-04 ENCOUNTER — Ambulatory Visit (HOSPITAL_COMMUNITY)
Admission: RE | Admit: 2018-05-04 | Discharge: 2018-05-04 | Disposition: A | Discharge status: Home / Self Care | Payer: Medicare Other | Source: Ambulatory Visit | Attending: Internal Medicine | Admitting: Internal Medicine

## 2018-05-04 DIAGNOSIS — R1010 Upper abdominal pain, unspecified: Secondary | ICD-10-CM | POA: Insufficient documentation

## 2018-05-04 DIAGNOSIS — R7989 Other specified abnormal findings of blood chemistry: Secondary | ICD-10-CM | POA: Diagnosis not present

## 2018-05-04 DIAGNOSIS — K5730 Diverticulosis of large intestine without perforation or abscess without bleeding: Secondary | ICD-10-CM | POA: Diagnosis not present

## 2018-05-04 MED ORDER — IOPAMIDOL (ISOVUE-300) INJECTION 61%
100.0000 mL | Freq: Once | INTRAVENOUS | Status: AC | PRN
  Administered 2018-05-04: 100 mL via INTRAVENOUS

## 2018-05-07 DIAGNOSIS — Z1231 Encounter for screening mammogram for malignant neoplasm of breast: Secondary | ICD-10-CM | POA: Diagnosis not present

## 2018-06-03 ENCOUNTER — Ambulatory Visit (INDEPENDENT_AMBULATORY_CARE_PROVIDER_SITE_OTHER): Payer: Medicare Other | Admitting: Obstetrics and Gynecology

## 2018-06-03 ENCOUNTER — Ambulatory Visit (HOSPITAL_COMMUNITY)
Admission: RE | Admit: 2018-06-03 | Discharge: 2018-06-03 | Disposition: A | Discharge status: Home / Self Care | Payer: Medicare Other | Source: Ambulatory Visit | Attending: Obstetrics and Gynecology | Admitting: Obstetrics and Gynecology

## 2018-06-03 ENCOUNTER — Encounter (INDEPENDENT_AMBULATORY_CARE_PROVIDER_SITE_OTHER): Payer: Self-pay

## 2018-06-03 ENCOUNTER — Other Ambulatory Visit (HOSPITAL_COMMUNITY)
Admission: RE | Admit: 2018-06-03 | Discharge: 2018-06-03 | Disposition: A | Discharge status: Home / Self Care | Payer: Medicare Other | Source: Ambulatory Visit | Attending: Obstetrics & Gynecology | Admitting: Obstetrics & Gynecology

## 2018-06-03 ENCOUNTER — Encounter: Payer: Self-pay | Admitting: Women's Health

## 2018-06-03 VITALS — BP 137/78 | HR 75 | Ht 62.0 in | Wt 129.0 lb

## 2018-06-03 DIAGNOSIS — D4989 Neoplasm of unspecified behavior of other specified sites: Secondary | ICD-10-CM

## 2018-06-03 DIAGNOSIS — D3912 Neoplasm of uncertain behavior of left ovary: Secondary | ICD-10-CM

## 2018-06-03 DIAGNOSIS — D251 Intramural leiomyoma of uterus: Secondary | ICD-10-CM | POA: Diagnosis not present

## 2018-06-03 DIAGNOSIS — N941 Unspecified dyspareunia: Secondary | ICD-10-CM

## 2018-06-03 DIAGNOSIS — N838 Other noninflammatory disorders of ovary, fallopian tube and broad ligament: Secondary | ICD-10-CM | POA: Diagnosis not present

## 2018-06-03 DIAGNOSIS — Z124 Encounter for screening for malignant neoplasm of cervix: Secondary | ICD-10-CM | POA: Insufficient documentation

## 2018-06-03 NOTE — Addendum Note (Signed)
Addended by: Gaylyn Rong A on: 06/03/2018 03:00 PM   Modules accepted: Orders
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
## 2018-06-04 LAB — CA 125: Cancer Antigen (CA) 125: 6.8 U/mL (ref 0.0–38.1)

## 2018-06-05 ENCOUNTER — Telehealth: Payer: Self-pay | Admitting: Obstetrics and Gynecology

## 2018-06-05 LAB — CYTOLOGY - PAP
Diagnosis: NEGATIVE
HPV: NOT DETECTED

## 2018-06-05 NOTE — Telephone Encounter (Signed)
Pt informed of benign nature of degenerating fibroid noted on u/s and CT. Ca 125 normal

## 2018-06-24 ENCOUNTER — Ambulatory Visit: Payer: Medicare Other | Admitting: Obstetrics and Gynecology

## 2018-07-02 ENCOUNTER — Ambulatory Visit (INDEPENDENT_AMBULATORY_CARE_PROVIDER_SITE_OTHER): Payer: Medicare Other | Admitting: Otolaryngology

## 2018-07-02 DIAGNOSIS — R590 Localized enlarged lymph nodes: Secondary | ICD-10-CM

## 2018-07-02 DIAGNOSIS — R490 Dysphonia: Secondary | ICD-10-CM

## 2018-07-02 DIAGNOSIS — D440 Neoplasm of uncertain behavior of thyroid gland: Secondary | ICD-10-CM

## 2018-07-10 DIAGNOSIS — Z1321 Encounter for screening for nutritional disorder: Secondary | ICD-10-CM | POA: Diagnosis not present

## 2018-07-10 DIAGNOSIS — E039 Hypothyroidism, unspecified: Secondary | ICD-10-CM | POA: Diagnosis not present

## 2018-07-10 DIAGNOSIS — N952 Postmenopausal atrophic vaginitis: Secondary | ICD-10-CM | POA: Diagnosis not present

## 2018-07-10 DIAGNOSIS — R5383 Other fatigue: Secondary | ICD-10-CM | POA: Diagnosis not present

## 2018-07-10 DIAGNOSIS — Z13220 Encounter for screening for lipoid disorders: Secondary | ICD-10-CM | POA: Diagnosis not present

## 2018-09-24 ENCOUNTER — Other Ambulatory Visit: Payer: Self-pay | Admitting: Otolaryngology

## 2018-11-17 ENCOUNTER — Encounter (HOSPITAL_BASED_OUTPATIENT_CLINIC_OR_DEPARTMENT_OTHER): Payer: Self-pay | Admitting: *Deleted

## 2018-11-17 ENCOUNTER — Other Ambulatory Visit: Payer: Self-pay

## 2018-11-20 ENCOUNTER — Other Ambulatory Visit (HOSPITAL_COMMUNITY)
Admission: RE | Admit: 2018-11-20 | Discharge: 2018-11-20 | Disposition: A | Discharge status: Home / Self Care | Payer: Medicare Other | Source: Ambulatory Visit | Attending: Otolaryngology | Admitting: Otolaryngology

## 2018-11-20 DIAGNOSIS — Z1159 Encounter for screening for other viral diseases: Secondary | ICD-10-CM | POA: Diagnosis not present

## 2018-11-20 LAB — SARS CORONAVIRUS 2 (TAT 6-24 HRS): SARS Coronavirus 2: NEGATIVE

## 2018-11-24 ENCOUNTER — Encounter (HOSPITAL_BASED_OUTPATIENT_CLINIC_OR_DEPARTMENT_OTHER): Payer: Self-pay | Admitting: *Deleted

## 2018-11-24 ENCOUNTER — Ambulatory Visit (HOSPITAL_BASED_OUTPATIENT_CLINIC_OR_DEPARTMENT_OTHER): Payer: Medicare Other | Admitting: Anesthesiology

## 2018-11-24 ENCOUNTER — Encounter (HOSPITAL_BASED_OUTPATIENT_CLINIC_OR_DEPARTMENT_OTHER)
Admission: RE | Disposition: A | Discharge status: Home / Self Care | Payer: Self-pay | Source: Home / Self Care | Attending: Otolaryngology

## 2018-11-24 ENCOUNTER — Ambulatory Visit (HOSPITAL_BASED_OUTPATIENT_CLINIC_OR_DEPARTMENT_OTHER)
Admission: RE | Admit: 2018-11-24 | Discharge: 2018-11-25 | Disposition: A | Discharge status: Home / Self Care | Payer: Medicare Other | Attending: Otolaryngology | Admitting: Otolaryngology

## 2018-11-24 DIAGNOSIS — D440 Neoplasm of uncertain behavior of thyroid gland: Secondary | ICD-10-CM | POA: Diagnosis not present

## 2018-11-24 DIAGNOSIS — Z8249 Family history of ischemic heart disease and other diseases of the circulatory system: Secondary | ICD-10-CM | POA: Insufficient documentation

## 2018-11-24 DIAGNOSIS — E782 Mixed hyperlipidemia: Secondary | ICD-10-CM | POA: Diagnosis not present

## 2018-11-24 DIAGNOSIS — K449 Diaphragmatic hernia without obstruction or gangrene: Secondary | ICD-10-CM | POA: Insufficient documentation

## 2018-11-24 DIAGNOSIS — E042 Nontoxic multinodular goiter: Secondary | ICD-10-CM | POA: Diagnosis present

## 2018-11-24 DIAGNOSIS — E0789 Other specified disorders of thyroid: Secondary | ICD-10-CM | POA: Diagnosis not present

## 2018-11-24 DIAGNOSIS — K219 Gastro-esophageal reflux disease without esophagitis: Secondary | ICD-10-CM | POA: Insufficient documentation

## 2018-11-24 DIAGNOSIS — Z9889 Other specified postprocedural states: Secondary | ICD-10-CM

## 2018-11-24 DIAGNOSIS — Z9049 Acquired absence of other specified parts of digestive tract: Secondary | ICD-10-CM | POA: Insufficient documentation

## 2018-11-24 DIAGNOSIS — Z803 Family history of malignant neoplasm of breast: Secondary | ICD-10-CM | POA: Diagnosis not present

## 2018-11-24 DIAGNOSIS — Z79899 Other long term (current) drug therapy: Secondary | ICD-10-CM | POA: Diagnosis not present

## 2018-11-24 DIAGNOSIS — K589 Irritable bowel syndrome without diarrhea: Secondary | ICD-10-CM | POA: Insufficient documentation

## 2018-11-24 DIAGNOSIS — E039 Hypothyroidism, unspecified: Secondary | ICD-10-CM | POA: Insufficient documentation

## 2018-11-24 DIAGNOSIS — E890 Postprocedural hypothyroidism: Secondary | ICD-10-CM

## 2018-11-24 DIAGNOSIS — G4709 Other insomnia: Secondary | ICD-10-CM | POA: Diagnosis not present

## 2018-11-24 DIAGNOSIS — C73 Malignant neoplasm of thyroid gland: Secondary | ICD-10-CM | POA: Insufficient documentation

## 2018-11-24 DIAGNOSIS — Z96659 Presence of unspecified artificial knee joint: Secondary | ICD-10-CM | POA: Diagnosis not present

## 2018-11-24 HISTORY — DX: Disorder of thyroid, unspecified: E07.9

## 2018-11-24 HISTORY — PX: THYROIDECTOMY: SHX17

## 2018-11-24 SURGERY — THYROIDECTOMY
Anesthesia: General | Site: Neck | Laterality: Right

## 2018-11-24 MED ORDER — PHENYLEPHRINE HCL (PRESSORS) 10 MG/ML IV SOLN
INTRAVENOUS | Status: DC | PRN
  Administered 2018-11-24: 80 ug via INTRAVENOUS

## 2018-11-24 MED ORDER — DEXAMETHASONE SODIUM PHOSPHATE 4 MG/ML IJ SOLN
INTRAMUSCULAR | Status: DC | PRN
  Administered 2018-11-24: 10 mg via INTRAVENOUS

## 2018-11-24 MED ORDER — SCOPOLAMINE 1 MG/3DAYS TD PT72: 1.0000 | MEDICATED_PATCH | Freq: Once | TRANSDERMAL | Status: DC

## 2018-11-24 MED ORDER — CEFAZOLIN SODIUM-DEXTROSE 2-4 GM/100ML-% IV SOLN
INTRAVENOUS | Status: AC
  Filled 2018-11-24: qty 100

## 2018-11-24 MED ORDER — MIDAZOLAM HCL 2 MG/2ML IJ SOLN
INTRAMUSCULAR | Status: AC
  Filled 2018-11-24: qty 2

## 2018-11-24 MED ORDER — TRAZODONE HCL 50 MG PO TABS
50.0000 mg | ORAL_TABLET | Freq: Two times a day (BID) | ORAL | Status: DC
  Administered 2018-11-24: 50 mg via ORAL

## 2018-11-24 MED ORDER — PROPOFOL 10 MG/ML IV BOLUS
INTRAVENOUS | Status: AC
  Filled 2018-11-24: qty 20

## 2018-11-24 MED ORDER — SUCCINYLCHOLINE CHLORIDE 200 MG/10ML IV SOSY
PREFILLED_SYRINGE | INTRAVENOUS | Status: AC
  Filled 2018-11-24: qty 10

## 2018-11-24 MED ORDER — FENTANYL CITRATE (PF) 100 MCG/2ML IJ SOLN
50.0000 ug | INTRAMUSCULAR | Status: DC | PRN
Start: 2018-11-24 — End: 2018-11-24
  Administered 2018-11-24: 11:00:00 100 ug via INTRAVENOUS

## 2018-11-24 MED ORDER — ONDANSETRON HCL 4 MG/2ML IJ SOLN
INTRAMUSCULAR | Status: DC | PRN
  Administered 2018-11-24: 4 mg via INTRAVENOUS

## 2018-11-24 MED ORDER — LACTATED RINGERS IV SOLN
INTRAVENOUS | Status: DC
  Administered 2018-11-24 (×2): via INTRAVENOUS

## 2018-11-24 MED ORDER — MIDAZOLAM HCL 2 MG/2ML IJ SOLN
1.0000 mg | INTRAMUSCULAR | Status: DC | PRN
  Administered 2018-11-24: 10:00:00 2 mg via INTRAVENOUS

## 2018-11-24 MED ORDER — MORPHINE SULFATE (PF) 4 MG/ML IV SOLN: 2.0000 mg | INTRAVENOUS | Status: DC | PRN

## 2018-11-24 MED ORDER — SUCCINYLCHOLINE CHLORIDE 20 MG/ML IJ SOLN
INTRAMUSCULAR | Status: DC | PRN
  Administered 2018-11-24: 120 mg via INTRAVENOUS

## 2018-11-24 MED ORDER — AMITRIPTYLINE HCL 25 MG PO TABS: 25.0000 mg | ORAL_TABLET | Freq: Every day | ORAL | Status: DC

## 2018-11-24 MED ORDER — PROPOFOL 10 MG/ML IV BOLUS
INTRAVENOUS | Status: DC | PRN
  Administered 2018-11-24: 150 mg via INTRAVENOUS

## 2018-11-24 MED ORDER — OXYCODONE-ACETAMINOPHEN 5-325 MG PO TABS: 1.0000 | ORAL_TABLET | ORAL | 0 refills | Status: AC | PRN

## 2018-11-24 MED ORDER — CEFAZOLIN SODIUM-DEXTROSE 1-4 GM/50ML-% IV SOLN
INTRAVENOUS | Status: DC | PRN
  Administered 2018-11-24: 2 g via INTRAVENOUS

## 2018-11-24 MED ORDER — LOPERAMIDE HCL 2 MG PO CAPS: 2.0000 mg | ORAL_CAPSULE | ORAL | Status: DC | PRN

## 2018-11-24 MED ORDER — KCL IN DEXTROSE-NACL 20-5-0.45 MEQ/L-%-% IV SOLN
INTRAVENOUS | Status: DC
  Administered 2018-11-24: 14:00:00 via INTRAVENOUS
  Filled 2018-11-24: qty 1000

## 2018-11-24 MED ORDER — CHOLESTYRAMINE 4 G PO PACK: 4.0000 g | PACK | Freq: Every day | ORAL | Status: DC

## 2018-11-24 MED ORDER — ACETAMINOPHEN 160 MG/5ML PO SOLN
650.0000 mg | ORAL | Status: DC | PRN
  Administered 2018-11-24: 650 mg via ORAL
  Filled 2018-11-24: qty 20.3

## 2018-11-24 MED ORDER — ONDANSETRON HCL 4 MG/2ML IJ SOLN
INTRAMUSCULAR | Status: AC
  Filled 2018-11-24: qty 2

## 2018-11-24 MED ORDER — FENTANYL CITRATE (PF) 100 MCG/2ML IJ SOLN
INTRAMUSCULAR | Status: AC
  Filled 2018-11-24: qty 2

## 2018-11-24 MED ORDER — FENTANYL CITRATE (PF) 100 MCG/2ML IJ SOLN
25.0000 ug | INTRAMUSCULAR | Status: DC | PRN
  Administered 2018-11-24 (×2): 25 ug via INTRAVENOUS

## 2018-11-24 MED ORDER — ACETAMINOPHEN 650 MG RE SUPP: 650.0000 mg | RECTAL | Status: DC | PRN

## 2018-11-24 MED ORDER — PANTOPRAZOLE SODIUM 40 MG PO TBEC: 40.0000 mg | DELAYED_RELEASE_TABLET | Freq: Two times a day (BID) | ORAL | Status: DC

## 2018-11-24 MED ORDER — OXYCODONE-ACETAMINOPHEN 5-325 MG PO TABS: 1.0000 | ORAL_TABLET | ORAL | Status: DC | PRN

## 2018-11-24 MED ORDER — LIDOCAINE 2% (20 MG/ML) 5 ML SYRINGE
INTRAMUSCULAR | Status: AC
  Filled 2018-11-24: qty 5

## 2018-11-24 MED ORDER — DEXAMETHASONE SODIUM PHOSPHATE 10 MG/ML IJ SOLN
INTRAMUSCULAR | Status: AC
  Filled 2018-11-24: qty 1

## 2018-11-24 MED ORDER — AMOXICILLIN 875 MG PO TABS: 875.0000 mg | ORAL_TABLET | Freq: Two times a day (BID) | ORAL | 0 refills | Status: AC

## 2018-11-24 MED ORDER — LEVOTHYROXINE SODIUM 50 MCG PO TABS: 50.0000 ug | ORAL_TABLET | Freq: Every day | ORAL | Status: DC

## 2018-11-24 MED ORDER — LIDOCAINE-EPINEPHRINE 1 %-1:100000 IJ SOLN
INTRAMUSCULAR | Status: DC | PRN
  Administered 2018-11-24: 2.5 mL

## 2018-11-24 MED ORDER — FLUOXETINE HCL 20 MG PO TABS
10.0000 mg | ORAL_TABLET | Freq: Every day | ORAL | Status: DC
  Administered 2018-11-24: 10 mg via ORAL

## 2018-11-24 SURGICAL SUPPLY — 62 items
ATTRACTOMAT 16X20 MAGNETIC DRP (DRAPES) IMPLANT
BLADE CLIPPER SURG (BLADE) IMPLANT
BLADE SURG 10 STRL SS (BLADE) IMPLANT
BLADE SURG 15 STRL LF DISP TIS (BLADE) ×1 IMPLANT
BLADE SURG 15 STRL SS (BLADE) ×2
CANISTER SUCT 1200ML W/VALVE (MISCELLANEOUS) ×3 IMPLANT
CLIP VESOCCLUDE SM WIDE 6/CT (CLIP) IMPLANT
CORD BIPOLAR FORCEPS 12FT (ELECTRODE) ×3 IMPLANT
COVER BACK TABLE REUSABLE LG (DRAPES) ×3 IMPLANT
COVER MAYO STAND REUSABLE (DRAPES) ×3 IMPLANT
COVER WAND RF STERILE (DRAPES) IMPLANT
DECANTER SPIKE VIAL GLASS SM (MISCELLANEOUS) IMPLANT
DERMABOND ADVANCED (GAUZE/BANDAGES/DRESSINGS) ×2
DERMABOND ADVANCED .7 DNX12 (GAUZE/BANDAGES/DRESSINGS) ×1 IMPLANT
DRAIN CHANNEL 10F 3/8 F FF (DRAIN) ×2 IMPLANT
DRAPE U-SHAPE 76X120 STRL (DRAPES) ×3 IMPLANT
ELECT COATED BLADE 2.86 ST (ELECTRODE) ×3 IMPLANT
ELECT REM PT RETURN 9FT ADLT (ELECTROSURGICAL) ×3
ELECTRODE REM PT RTRN 9FT ADLT (ELECTROSURGICAL) ×1 IMPLANT
EVACUATOR SILICONE 100CC (DRAIN) ×2 IMPLANT
FORCEPS BIPOLAR SPETZLER 8 1.0 (NEUROSURGERY SUPPLIES) ×3 IMPLANT
GAUZE 4X4 16PLY RFD (DISPOSABLE) ×3 IMPLANT
GAUZE SPONGE 4X4 12PLY STRL LF (GAUZE/BANDAGES/DRESSINGS) IMPLANT
GLOVE BIO SURGEON STRL SZ 6.5 (GLOVE) IMPLANT
GLOVE BIO SURGEON STRL SZ7.5 (GLOVE) ×5 IMPLANT
GLOVE BIO SURGEONS STRL SZ 6.5 (GLOVE)
GLOVE BIOGEL M STRL SZ7.5 (GLOVE) ×2 IMPLANT
GLOVE BIOGEL PI IND STRL 7.5 (GLOVE) IMPLANT
GLOVE BIOGEL PI INDICATOR 7.5 (GLOVE) ×2
GOWN STRL REUS W/ TWL LRG LVL3 (GOWN DISPOSABLE) ×2 IMPLANT
GOWN STRL REUS W/TWL LRG LVL3 (GOWN DISPOSABLE) ×6
HEMOSTAT SURGICEL 2X14 (HEMOSTASIS) IMPLANT
NDL HYPO 25X1 1.5 SAFETY (NEEDLE) ×1 IMPLANT
NEEDLE HYPO 25X1 1.5 SAFETY (NEEDLE) ×3 IMPLANT
NS IRRIG 1000ML POUR BTL (IV SOLUTION) ×3 IMPLANT
PACK BASIN DAY SURGERY FS (CUSTOM PROCEDURE TRAY) ×3 IMPLANT
PENCIL BUTTON HOLSTER BLD 10FT (ELECTRODE) ×3 IMPLANT
PIN SAFETY STERILE (MISCELLANEOUS) ×2 IMPLANT
PROBE NERVBE PRASS .33 (MISCELLANEOUS) ×3 IMPLANT
SHEARS HARMONIC 9CM CVD (BLADE) ×3 IMPLANT
SLEEVE SCD COMPRESS KNEE MED (MISCELLANEOUS) ×3 IMPLANT
SPONGE INTESTINAL PEANUT (DISPOSABLE) ×3 IMPLANT
STAPLER VISISTAT 35W (STAPLE) IMPLANT
SUT ETHILON 3 0 PS 1 (SUTURE) ×3 IMPLANT
SUT PROLENE 5 0 P 3 (SUTURE) IMPLANT
SUT SILK 2 0 SH (SUTURE) ×3 IMPLANT
SUT SILK 2 0 TIES 17X18 (SUTURE)
SUT SILK 2-0 18XBRD TIE BLK (SUTURE) IMPLANT
SUT SILK 3 0 TIES 17X18 (SUTURE) ×4
SUT SILK 3-0 18XBRD TIE BLK (SUTURE) ×2 IMPLANT
SUT VIC AB 3-0 FS2 27 (SUTURE) ×3 IMPLANT
SUT VICRYL 4-0 PS2 18IN ABS (SUTURE) ×3 IMPLANT
SYR BULB 3OZ (MISCELLANEOUS) ×3 IMPLANT
SYR CONTROL 10ML LL (SYRINGE) ×3 IMPLANT
TOWEL GREEN STERILE FF (TOWEL DISPOSABLE) ×6 IMPLANT
TRAY DSU PREP LF (CUSTOM PROCEDURE TRAY) ×3 IMPLANT
TUBE CONNECTING 20'X1/4 (TUBING) ×1
TUBE CONNECTING 20X1/4 (TUBING) ×2 IMPLANT
TUBE ENDOTRAC NIMS EMG 6MM (MISCELLANEOUS) IMPLANT
TUBE ENDOTRAC NIMS EMG 7MM (MISCELLANEOUS) ×2 IMPLANT
TUBE ENDOTRAC NIMS EMG 8MM (MISCELLANEOUS) IMPLANT
TUBE ENDOTRAC NIMS EMG 9MM (MISCELLANEOUS) IMPLANT

## 2018-11-24 NOTE — Anesthesia Procedure Notes (Signed)
Procedure Name: Intubation Date/Time: 11/24/2018 10:33 AM Performed by: Maryella Shivers, CRNA Pre-anesthesia Checklist: Patient identified, Emergency Drugs available, Suction available and Patient being monitored Patient Re-evaluated:Patient Re-evaluated prior to induction Oxygen Delivery Method: Circle system utilized Preoxygenation: Pre-oxygenation with 100% oxygen Induction Type: IV induction Ventilation: Mask ventilation without difficulty Laryngoscope Size: Mac and 3 Tube type: Oral Tube size: 7.0 mm Number of attempts: 1 Airway Equipment and Method: Stylet and Oral airway Placement Confirmation: ETT inserted through vocal cords under direct vision,  positive ETCO2 and breath sounds checked- equal and bilateral Tube secured with: Tape Dental Injury: Teeth and Oropharynx as per pre-operative assessment

## 2018-11-24 NOTE — Anesthesia Preprocedure Evaluation (Addendum)
Anesthesia Evaluation  Patient identified by MRN, date of birth, ID band Patient awake    Reviewed: Allergy & Precautions, NPO status , Patient's Chart, lab work & pertinent test results  Airway Mallampati: II  TM Distance: >3 FB     Dental   Pulmonary    breath sounds clear to auscultation       Cardiovascular negative cardio ROS   Rhythm:Regular Rate:Normal     Neuro/Psych    GI/Hepatic Neg liver ROS, hiatal hernia, GERD  ,  Endo/Other  Hypothyroidism   Renal/GU negative Renal ROS     Musculoskeletal   Abdominal   Peds  Hematology   Anesthesia Other Findings   Reproductive/Obstetrics                             Anesthesia Physical Anesthesia Plan  ASA: III  Anesthesia Plan: General   Post-op Pain Management:    Induction: Intravenous  PONV Risk Score and Plan: 3 and Ondansetron and Midazolam  Airway Management Planned:   Additional Equipment:   Intra-op Plan:   Post-operative Plan: Extubation in OR  Informed Consent: I have reviewed the patients History and Physical, chart, labs and discussed the procedure including the risks, benefits and alternatives for the proposed anesthesia with the patient or authorized representative who has indicated his/her understanding and acceptance.     Dental advisory given  Plan Discussed with: CRNA and Anesthesiologist  Anesthesia Plan Comments:         Anesthesia Quick Evaluation

## 2018-11-24 NOTE — Op Note (Signed)
DATE OF PROCEDURE:  11/24/2018                              OPERATIVE REPORT  SURGEON:  Leta Baptist, MD  PREOPERATIVE DIAGNOSES: 1. Right thyroid mass  POSTOPERATIVE DIAGNOSES: 1. Right thyroid mass  PROCEDURE PERFORMED: Right total thyroid lobectomy.  ANESTHESIA:  General endotracheal tube anesthesia.  COMPLICATIONS:  None.  ESTIMATED BLOOD LOSS:  41ml  INDICATION FOR PROCEDURE:  [name] is a 68 y.o. female with a history of multinodular thyroid goiter.  The patient was previously evaluated with fine-needle aspiration biopsy of her nodules.  Her left thyroid nodule was noted to be benign.  However, the right nodule was noted to have cellular atypia.  Based on the above findings, the decision was made for the patient to undergo the right hemithyroidectomy surgery.  The risks, benefits, alternatives, and details of the procedure were discussed with the patient.  Questions were invited and answered.  Informed consent was obtained.  DESCRIPTION:  The patient was taken to the operating room and placed supine on the operating table.  General endotracheal tube anesthesia was administered by the anesthesiologist.  The patient was positioned and prepped and draped in a standard fashion for thyroidectomy surgery. The NIMS nerve monitoring endotracheal tube was used.  Nerve monitoring system was functional throughout the case.  1% lidocaine with 1-100,000 epinephrine was infiltrated at the planned site of incision.  A lower neck transverse incision was made.  The incision was made carried down past the level of the platysma muscles.  Superiorly and inferiorly based subplatysmal flaps were elevated in the standard fashion.  The strap muscles were divided at midline and retracted laterally, exposing the thyroid gland.  The right thyroid lobe was carefully dissected free from the surrounding soft tissue.  The recurrent laryngeal nerve was identified and preserved.  The entire right lobe was sent to  the pathology department for permanent histologic identification.    The surgical site was copiously irrigated.  A #10 JP drain was placed.  The strap muscles were reapproximated with 4-0 Vicryl sutures.  The incision was closed in layers with 4-0 Vicryl and Dermabond.  The care of the patient was turned over to the anesthesiologist.  The patient was awakened from anesthesia without difficulty.  The patient was extubated and transferred to the recovery room in good condition.  OPERATIVE FINDINGS: Right thyroid mass.  SPECIMEN: The right thyroid lobe.  FOLLOWUP CARE:  The patient will be observed overnight at the [facility].  Krystl Wickware W Kylene Zamarron 11/24/2018 11:56 AM

## 2018-11-24 NOTE — Transfer of Care (Signed)
Immediate Anesthesia Transfer of Care Note  Patient: Lacey Ramsey  Procedure(s) Performed: RIGHT HEMI THYROIDECTOMY (Right Neck)  Patient Location: PACU  Anesthesia Type:General  Level of Consciousness: sedated  Airway & Oxygen Therapy: Patient Spontanous Breathing and Patient connected to face mask oxygen  Post-op Assessment: Report given to RN and Post -op Vital signs reviewed and stable  Post vital signs: Reviewed and stable  Last Vitals:  Vitals Value Taken Time  BP 146/83 11/24/18 1207  Temp    Pulse 71 11/24/18 1211  Resp 12 11/24/18 1211  SpO2 100 % 11/24/18 1211  Vitals shown include unvalidated device data.  Last Pain:  Vitals:   11/24/18 0843  TempSrc: Oral  PainSc: 0-No pain         Complications: No apparent anesthesia complications

## 2018-11-24 NOTE — H&P (Signed)
Cc: Right thyroid mass  HPI: The patient is a 68 y/o female who presents today for evaluation of a multinodular thyroid goiter. The patient is seen in consultation requested by Dr. Haynes Hoehn. The patient has a history of a multinodular thyroid goiter. The patient was seen at [facility] for ENT evaluation with ultrasound and FNA performed. She was noted to have bilateral thyroid nodules with the largest measuring 3.2 cm on the left. There was also a 1.5 cm nodule on the right. The patient underwent FNA which showed the left nodule to be benign. The right nodule was noted to have atypia of undetermined significance. [facility]NT at [facility] recommend repeat biopsy in 6 months versus hemithyroidectomy. The patient has since moved to [place] and has not followed up for re-evaluation. She has not noted any change in the nodules. No compressive symptoms are noted. The patient has noted very dry skin, hair, and nails. Her thyroid function tests have been normal. The patient has noted intermittent hoarseness. She has a history of GERD but only takes medication prn. Tobacco use is denied.  The patient's review of systems (constitutional, eyes, ENT, cardiovascular, respiratory, GI, musculoskeletal, skin, neurologic, psychiatric, endocrine, hematologic, allergic) is noted in the ROS questionnaire.  It is reviewed with the patient.   Family health history: Breast cancer, heart disease.  Major events: Tonsillectomy, gallbladder removed, hernia repair, knee replacement, tubal ligation.  Ongoing medical problems: Reflux, IBS,.  Social history: The patient is married. She denies the use of tobacco, alcohol or illegal drugs.   Exam: General: Communicates without difficulty, well nourished, no acute distress. Head: Normocephalic, no evidence injury, no tenderness, facial buttresses intact without stepoff. Eyes: PERRL, EOMI.  No scleral icterus, conjunctivae clear. Ears: External auditory canals clear bilaterally.  There is no edema or  erythema.  Tympanic membrane is within normal limits bilaterally. Nose: Normal skin and external support.  Anterior rhinoscopy reveals healthy pink mucosa over the septum and turbinates.  No lesions or polyps were seen. Oral cavity: Lips without lesions, oral mucosa moist, no masses or lesions seen. Indirect  mirror laryngoscopy could not be tolerated. Pharynx: Clear, no erythema. Neck: Supple, full range of motion, no lymphadenopathy, no masses palpable. Salivary: Parotid and submandibular glands without mass. Enlarged thyroid bed. Neuro:  CN 2-12 grossly intact. Gait normal. Vestibular: No nystagmus at any point of gaze.   Procedure:  Flexible Fiberoptic Laryngoscopy -- Risks, benefits, and alternatives of flexible endoscopy were explained to the patient.  Specific mention was made of the risk of throat numbness with difficulty swallowing, possible bleeding from the nose and mouth, and pain from the procedure.  The patient gave oral consent to proceed.  The nasal cavities were decongested and anesthetised with a combination of oxymetazoline and 4% lidocaine solution.  The flexible scope was inserted into the right nasal cavity and advanced towards the nasopharynx.  Visualized mucosa over the turbinates and septum were normal.  The nasopharynx was clear.  Oropharyngeal walls were symmetric and mobile without lesion, mass, or edema.  Hypopharynx was also without  lesion or edema.  Larynx was mobile without lesions.  No lesions or asymmetry in the supraglottic larynx.  Arytenoid mucosa was edematous with slight erythema.  True vocal folds were mobile.  Base of tongue was within normal limits. The patient tolerated the procedure well.   Assessment 1. Multinodular thyroid goiter.  2. Left nodule was noted to be benign on FNA performed at [facility] but the right nodule showed atypia of undetermined significance.  3. The patient is currently asymptomatic.  4. The patient's hoarseness is likely related to  laryngopharyngeal reflux.  Moderate posterior laryngeal edema is noted today.  No other suspicious mass or lesion is noted on today's fiberoptic laryngoscopy exam.  5. Vocal cords are mobile.   Plan  1. The physical exam, laryngoscopy, and FNA findings are reviewed with the patient at length. All questions and concerns are addressed.  2. Treatment options include repeat FNA versus right hemithyroidectomy. The only definitive way of determining the histology of the nodule is by excising the thyroid nodule via a hemithyroidectomy procedure. The risks, benefits, alternatives, and details of the procedure are reviewed with the patient. Questions are invited and answered. 3. The patient is interested in proceeding with the procedure.  We will schedule the procedure in accordance with the family schedule.

## 2018-11-24 NOTE — Anesthesia Postprocedure Evaluation (Signed)
Anesthesia Post Note  Patient: Lacey Ramsey  Procedure(s) Performed: RIGHT HEMI THYROIDECTOMY (Right Neck)     Patient location during evaluation: PACU Anesthesia Type: General Level of consciousness: awake Pain management: pain level controlled Vital Signs Assessment: post-procedure vital signs reviewed and stable Respiratory status: spontaneous breathing Cardiovascular status: stable Postop Assessment: no apparent nausea or vomiting Anesthetic complications: no    Last Vitals:  Vitals:   11/24/18 1215 11/24/18 1230  BP: 140/80 133/61  Pulse: 74 70  Resp: 15 16  Temp:    SpO2: 100% 100%    Last Pain:  Vitals:   11/24/18 1215  TempSrc:   PainSc: 0-No pain                 Ethaniel Garfield

## 2018-11-24 NOTE — H&P (View-Only) (Signed)
Cc: Right thyroid mass  HPI: The patient is a 68 y/o female who presents today for evaluation of a multinodular thyroid goiter. The patient is seen in consultation requested by Dr. Haynes Hoehn. The patient has a history of a multinodular thyroid goiter. The patient was seen at [facility] for ENT evaluation with ultrasound and FNA performed. She was noted to have bilateral thyroid nodules with the largest measuring 3.2 cm on the left. There was also a 1.5 cm nodule on the right. The patient underwent FNA which showed the left nodule to be benign. The right nodule was noted to have atypia of undetermined significance. ENT at [facility] recommend repeat biopsy in 6 months versus hemithyroidectomy. The patient has since moved to [place] and has not followed up for re-evaluation. She has not noted any change in the nodules. No compressive symptoms are noted. The patient has noted very dry skin, hair, and nails. Her thyroid function tests have been normal. The patient has noted intermittent hoarseness. She has a history of GERD but only takes medication prn. Tobacco use is denied.  The patient's review of systems (constitutional, eyes, ENT, cardiovascular, respiratory, GI, musculoskeletal, skin, neurologic, psychiatric, endocrine, hematologic, allergic) is noted in the ROS questionnaire.  It is reviewed with the patient.   Family health history: Breast cancer, heart disease.  Major events: Tonsillectomy, gallbladder removed, hernia repair, knee replacement, tubal ligation.  Ongoing medical problems: Reflux, IBS,.  Social history: The patient is married. She denies the use of tobacco, alcohol or illegal drugs.   Exam: General: Communicates without difficulty, well nourished, no acute distress. Head: Normocephalic, no evidence injury, no tenderness, facial buttresses intact without stepoff. Eyes: PERRL, EOMI.  No scleral icterus, conjunctivae clear. Ears: External auditory canals clear bilaterally.  There is no edema or  erythema.  Tympanic membrane is within normal limits bilaterally. Nose: Normal skin and external support.  Anterior rhinoscopy reveals healthy pink mucosa over the septum and turbinates.  No lesions or polyps were seen. Oral cavity: Lips without lesions, oral mucosa moist, no masses or lesions seen. Indirect  mirror laryngoscopy could not be tolerated. Pharynx: Clear, no erythema. Neck: Supple, full range of motion, no lymphadenopathy, no masses palpable. Salivary: Parotid and submandibular glands without mass. Enlarged thyroid bed. Neuro:  CN 2-12 grossly intact. Gait normal. Vestibular: No nystagmus at any point of gaze.   Procedure:  Flexible Fiberoptic Laryngoscopy -- Risks, benefits, and alternatives of flexible endoscopy were explained to the patient.  Specific mention was made of the risk of throat numbness with difficulty swallowing, possible bleeding from the nose and mouth, and pain from the procedure.  The patient gave oral consent to proceed.  The nasal cavities were decongested and anesthetised with a combination of oxymetazoline and 4% lidocaine solution.  The flexible scope was inserted into the right nasal cavity and advanced towards the nasopharynx.  Visualized mucosa over the turbinates and septum were normal.  The nasopharynx was clear.  Oropharyngeal walls were symmetric and mobile without lesion, mass, or edema.  Hypopharynx was also without  lesion or edema.  Larynx was mobile without lesions.  No lesions or asymmetry in the supraglottic larynx.  Arytenoid mucosa was edematous with slight erythema.  True vocal folds were mobile.  Base of tongue was within normal limits. The patient tolerated the procedure well.   Assessment 1. Multinodular thyroid goiter.  2. Left nodule was noted to be benign on FNA performed at [facility] but the right nodule showed atypia of undetermined significance.  3. The patient is currently asymptomatic.  4. The patient's hoarseness is likely related to  laryngopharyngeal reflux.  Moderate posterior laryngeal edema is noted today.  No other suspicious mass or lesion is noted on today's fiberoptic laryngoscopy exam.  5. Vocal cords are mobile.   Plan  1. The physical exam, laryngoscopy, and FNA findings are reviewed with the patient at length. All questions and concerns are addressed.  2. Treatment options include repeat FNA versus right hemithyroidectomy. The only definitive way of determining the histology of the nodule is by excising the thyroid nodule via a hemithyroidectomy procedure. The risks, benefits, alternatives, and details of the procedure are reviewed with the patient. Questions are invited and answered. 3. The patient is interested in proceeding with the procedure.  We will schedule the procedure in accordance with the family schedule.

## 2018-11-24 NOTE — Discharge Instructions (Addendum)
Thyroidectomy, Care After This sheet gives you information about how to care for yourself after your procedure. Your health care provider may also give you more specific instructions. If you have problems or questions, contact your health care provider. What can I expect after the procedure? After the procedure, it is common to have:  Mild pain in the neck or upper body, especially when swallowing.  A swollen neck.  A sore throat.  A weak or hoarse voice.  Slight tingling or numbness around your mouth, or in your fingers or toes. This may last for a day or two after surgery. This condition is caused by low levels of calcium. You may be given calcium supplements to treat it. Follow these instructions at home:  Medicines  Take over-the-counter and prescription medicines only as told by your health care provider.  Do not drive or use heavy machinery while taking prescription pain medicine.  Do not take medicines that contain aspirin and ibuprofen until your health care provider says that you can. These medicines can increase your risk of bleeding.  Take a thyroid hormone medicine as recommended by your health care provider. You will have to take this medicine for the rest of your life if your entire thyroid was removed. Eating and drinking  Start slowly with eating. You may need to have only liquids and soft foods for a few days or as directed by your health care provider.  To prevent or treat constipation while you are taking prescription pain medicine, your health care provider may recommend that you: ? Drink enough fluid to keep your urine pale yellow. ? Take over-the-counter or prescription medicines. ? Eat foods that are high in fiber, such as fresh fruits and vegetables, whole grains, and beans. ? Limit foods that are high in fat and processed sugars, such as fried and sweet foods. Incision care  Follow instructions from your health care provider about how to take care of your  incision. Make sure you: ? Leave stitches (sutures), skin glue, or adhesive strips in place. These skin closures may need to stay in place for 2 weeks or longer. If adhesive strip edges start to loosen and curl up, you may trim the loose edges. Do not remove adhesive strips completely unless your health care provider tells you to do that.  Check your incision area every day for signs of infection. Check for: ? Redness, swelling, or pain. ? Fluid or blood. ? Warmth. ? Pus or a bad smell. Activity  For the first 10 days after the procedure or as instructed by your health care provider: ? Do not lift anything that is heavier than 10 lb (4.5 kg). ? Do not jog, swim, or do other strenuous exercises. ? Do not play contact sports.  Avoid sitting for a long time without moving. Get up to take short walks every 1-2 hours. This is needed to improve blood flow and breathing. Ask for help if you feel weak or unsteady.  Return to your normal activities as told by your health care provider. Ask your health care provider what activities are safe for you. General instructions  Do not use any products that contain nicotine or tobacco, such as cigarettes and e-cigarettes. These can delay healing after surgery. If you need help quitting, ask your health care provider.  Keep all follow-up visits as told by your health care provider. This is important. Your health care provider needs to monitor the calcium level in your blood to make sure that it  does not become low. Contact a health care provider if you:  Have a fever.  Have more redness, swelling, or pain around your incision area.  Have fluid or blood coming from your incision area.  Notice that your incision area feels warm to the touch.  Have pus or a bad smell coming from your incision area.  Have trouble talking.  Have nausea or vomiting for more than 2 days. Get help right away if you:  Have trouble breathing.  Have trouble  swallowing.  Develop a rash.  Develop a cough that gets worse.  Notice that your speech changes, or you have hoarseness that gets worse.  Develop numbness, tingling, or muscle spasms in the arms, hands, feet, or face. Summary  After the procedure, it is common to feel mild pain in the neck or upper body, especially when swallowing.  Take medicines as told by your health care provider. These include pain medicines and thyroid hormones, if required.  Follow instructions from your health care provider about how to take care of your incision. Watch for signs of infection.  Keep all follow-up visits as told by your health care provider. This is important. Your health care provider needs to monitor the calcium level in your blood to make sure that it does not become low.  Get help right away if you develop difficulty breathing, or numbness, tingling, or muscle spasms in the arms, hands, feet, or face. This information is not intended to replace advice given to you by your health care provider. Make sure you discuss any questions you have with your health care provider. Document Released: 11/30/2004 Document Revised: 07/09/2018 Document Reviewed: 03/18/2017 Elsevier Patient Education  [place] Instructions  Activity: Get plenty of rest for the remainder of the day. A responsible individual must stay with you for 24 hours following the procedure.  For the next 24 hours, DO NOT: -Drive a car -Paediatric nurse -Drink alcoholic beverages -Take any medication unless instructed by your physician -Make any legal decisions or sign important papers.  Meals: Start with liquid foods such as gelatin or soup. Progress to regular foods as tolerated. Avoid greasy, spicy, heavy foods. If nausea and/or vomiting occur, drink only clear liquids until the nausea and/or vomiting subsides. Call your physician if vomiting continues.  Special  Instructions/Symptoms: Your throat may feel dry or sore from the anesthesia or the breathing tube placed in your throat during surgery. If this causes discomfort, gargle with warm salt water. The discomfort should disappear within 24 hours.  If you had a scopolamine patch placed behind your ear for the management of post- operative nausea and/or vomiting:  1. The medication in the patch is effective for 72 hours, after which it should be removed.  Wrap patch in a tissue and discard in the trash. Wash hands thoroughly with soap and water. 2. You may remove the patch earlier than 72 hours if you experience unpleasant side effects which may include dry mouth, dizziness or visual disturbances. 3. Avoid touching the patch. Wash your hands with soap and water after contact with the patch.

## 2018-11-25 NOTE — Discharge Summary (Signed)
Physician Discharge Summary  Patient ID: Lacey Ramsey MRN: [id number] DOB/AGE: 06-02-1950 68 y.o.  Admit date: 11/24/2018 Discharge date: 11/25/2018  Admission Diagnoses: Right thyroid mass  Discharge Diagnoses: Right thyroid mass Active Problems:   S/P partial thyroidectomy   Discharged Condition: good  Hospital Course: Pt had an uneventful overnight stay. Pt tolerated po well. No bleeding. No stridor. Voice is strong.  Consults: None  Significant Diagnostic Studies: None  Treatments: surgery: Right hemithyroidectomy  Discharge Exam: Blood pressure 108/62, pulse 87, temperature 97.8 F (36.6 C), resp. rate 18, height 5\' 2"  (1.575 m), weight 58 kg, SpO2 94 %. Incision/Wound:c/d/i Voice is strong.  Disposition: Discharge disposition: 01-Home or Self Care       Discharge Instructions    Activity as tolerated - No restrictions   Complete by: As directed    Diet general   Complete by: As directed      Allergies as of 11/25/2018      Reactions   Codeine Nausea And Vomiting   Other    Surgical glue-itchy rash      Medication List    TAKE these medications   amitriptyline 25 MG tablet Commonly known as: ELAVIL Take 25 mg by mouth at bedtime.   amoxicillin 875 MG tablet Commonly known as: AMOXIL Take 1 tablet (875 mg total) by mouth 2 (two) times daily for 5 days.   b complex vitamins tablet Take 1 tablet by mouth daily.   BIOTIN PO Take by mouth daily.   cholestyramine 4 g packet Commonly known as: QUESTRAN Take 1 packet (4 g total) by mouth 2 (two) times daily. What changed: when to take this   FLUoxetine 10 MG tablet Commonly known as: PROZAC Take 10 mg by mouth daily.   levothyroxine 50 MCG tablet Commonly known as: SYNTHROID Take 50 mcg by mouth daily before breakfast.   loperamide 2 MG capsule Commonly known as: IMODIUM Take 1 capsule (2 mg total) by mouth as needed for diarrhea or loose stools.   oxyCODONE-acetaminophen 5-325 MG  tablet Commonly known as: Percocet Take 1 tablet by mouth every 4 (four) hours as needed for up to 4 days for severe pain.   pantoprazole 40 MG tablet Commonly known as: Protonix Take 1 tablet (40 mg total) by mouth 2 (two) times daily before a meal. What changed:   when to take this  reasons to take this   traZODone 50 MG tablet Commonly known as: DESYREL Take 50 mg by mouth 2 (two) times daily.   VITAMIN D PO Take by mouth daily.      Follow-up Information    Leta Baptist, MD On 12/03/2018.   Specialty: Otolaryngology Why: at [facility] [employer] information: [address] [place] [address] [phone]           Signed: Burley Saver 11/25/2018, 7:21 AM

## 2018-11-26 ENCOUNTER — Other Ambulatory Visit: Payer: Self-pay

## 2018-11-26 ENCOUNTER — Other Ambulatory Visit: Payer: Self-pay | Admitting: Otolaryngology

## 2018-11-26 ENCOUNTER — Encounter (HOSPITAL_BASED_OUTPATIENT_CLINIC_OR_DEPARTMENT_OTHER): Payer: Self-pay | Admitting: Otolaryngology

## 2018-11-26 NOTE — Progress Notes (Signed)
Spoke with pt for pre-op call. Pt just had surgery on 6/30 at [facility]. She states nothing has changed with her medications, medical or surgical history. Pt will need to have Covid testing done in AM.   Coronavirus Screening  Have you experienced the following symptoms:  Cough NO Fever (>100.53F)  NO Runny nose NO Sore throat NO Difficulty breathing/shortness of breath  NO Have you or a family member traveled in the last 14 days and where? NO   Patient reminde that hospital visitation restrictions are in effect and the importance of the restrictions.

## 2018-11-27 ENCOUNTER — Inpatient Hospital Stay (HOSPITAL_COMMUNITY): Payer: Medicare Other | Admitting: Certified Registered"

## 2018-11-27 ENCOUNTER — Ambulatory Visit: Admit: 2018-11-27 | Payer: Medicare Other | Admitting: Otolaryngology

## 2018-11-27 ENCOUNTER — Encounter (HOSPITAL_COMMUNITY): Payer: Self-pay | Admitting: *Deleted

## 2018-11-27 ENCOUNTER — Other Ambulatory Visit: Payer: Self-pay

## 2018-11-27 ENCOUNTER — Ambulatory Visit (HOSPITAL_COMMUNITY)
Admission: RE | Admit: 2018-11-27 | Discharge: 2018-11-28 | Disposition: A | Discharge status: Home / Self Care | Payer: Medicare Other | Attending: Otolaryngology | Admitting: Otolaryngology

## 2018-11-27 ENCOUNTER — Encounter (HOSPITAL_COMMUNITY)
Admission: RE | Disposition: A | Discharge status: Home / Self Care | Payer: Self-pay | Source: Home / Self Care | Attending: Otolaryngology

## 2018-11-27 DIAGNOSIS — E063 Autoimmune thyroiditis: Secondary | ICD-10-CM | POA: Insufficient documentation

## 2018-11-27 DIAGNOSIS — Z79890 Hormone replacement therapy (postmenopausal): Secondary | ICD-10-CM | POA: Diagnosis not present

## 2018-11-27 DIAGNOSIS — E890 Postprocedural hypothyroidism: Secondary | ICD-10-CM

## 2018-11-27 DIAGNOSIS — K219 Gastro-esophageal reflux disease without esophagitis: Secondary | ICD-10-CM | POA: Insufficient documentation

## 2018-11-27 DIAGNOSIS — C73 Malignant neoplasm of thyroid gland: Principal | ICD-10-CM | POA: Insufficient documentation

## 2018-11-27 DIAGNOSIS — E039 Hypothyroidism, unspecified: Secondary | ICD-10-CM | POA: Insufficient documentation

## 2018-11-27 DIAGNOSIS — Z79899 Other long term (current) drug therapy: Secondary | ICD-10-CM | POA: Insufficient documentation

## 2018-11-27 DIAGNOSIS — Z1159 Encounter for screening for other viral diseases: Secondary | ICD-10-CM | POA: Diagnosis not present

## 2018-11-27 DIAGNOSIS — F419 Anxiety disorder, unspecified: Secondary | ICD-10-CM | POA: Diagnosis not present

## 2018-11-27 DIAGNOSIS — E782 Mixed hyperlipidemia: Secondary | ICD-10-CM | POA: Diagnosis not present

## 2018-11-27 DIAGNOSIS — F411 Generalized anxiety disorder: Secondary | ICD-10-CM | POA: Diagnosis not present

## 2018-11-27 DIAGNOSIS — Z9889 Other specified postprocedural states: Secondary | ICD-10-CM

## 2018-11-27 HISTORY — PX: THYROIDECTOMY: SHX17

## 2018-11-27 HISTORY — DX: Malignant (primary) neoplasm, unspecified: C80.1

## 2018-11-27 LAB — BASIC METABOLIC PANEL
Anion gap: 12 (ref 5–15)
BUN: 15 mg/dL (ref 8–23)
CO2: 22 mmol/L (ref 22–32)
Calcium: 9.2 mg/dL (ref 8.9–10.3)
Chloride: 106 mmol/L (ref 98–111)
Creatinine, Ser: 0.85 mg/dL (ref 0.44–1.00)
GFR calc Af Amer: >60 mL/min (ref >60)
GFR calc non Af Amer: >60 mL/min (ref >60)
Glucose, Bld: 75 mg/dL (ref 70–99)
Potassium: 4.3 mmol/L (ref 3.5–5.1)
Sodium: 140 mmol/L (ref 135–145)

## 2018-11-27 LAB — CBC
HCT: 39.8 % (ref 36.0–46.0)
Hemoglobin: 12.6 g/dL (ref 12.0–15.0)
MCH: 29.9 pg (ref 26.0–34.0)
MCHC: 31.7 g/dL (ref 30.0–36.0)
MCV: 94.5 fL (ref 80.0–100.0)
Platelets: 177 10*3/uL (ref 150–400)
RBC: 4.21 MIL/uL (ref 3.87–5.11)
RDW: 12.3 % (ref 11.5–15.5)
WBC: 4.8 10*3/uL (ref 4.0–10.5)
nRBC: 0 % (ref 0.0–0.2)

## 2018-11-27 LAB — SARS CORONAVIRUS 2 BY RT PCR (HOSPITAL ORDER, PERFORMED IN ~~LOC~~ HOSPITAL LAB): SARS Coronavirus 2: NEGATIVE

## 2018-11-27 LAB — CALCIUM
Calcium: 8.8 mg/dL — ABNORMAL LOW (ref 8.9–10.3)
Calcium: 8.5 mg/dL — ABNORMAL LOW (ref 8.9–10.3)

## 2018-11-27 SURGERY — THYROIDECTOMY
Anesthesia: General | Site: Neck

## 2018-11-27 SURGERY — THYROIDECTOMY
Anesthesia: General

## 2018-11-27 MED ORDER — MIDAZOLAM HCL 2 MG/2ML IJ SOLN
INTRAMUSCULAR | Status: AC
  Filled 2018-11-27: qty 2

## 2018-11-27 MED ORDER — ONDANSETRON HCL 4 MG/2ML IJ SOLN
INTRAMUSCULAR | Status: DC | PRN
  Administered 2018-11-27: 4 mg via INTRAVENOUS

## 2018-11-27 MED ORDER — GABAPENTIN 300 MG PO CAPS
300.0000 mg | ORAL_CAPSULE | Freq: Once | ORAL | Status: AC
  Administered 2018-11-27: 300 mg via ORAL
  Filled 2018-11-27: qty 1

## 2018-11-27 MED ORDER — DEXAMETHASONE SODIUM PHOSPHATE 10 MG/ML IJ SOLN
INTRAMUSCULAR | Status: DC | PRN
  Administered 2018-11-27: 10 mg via INTRAVENOUS

## 2018-11-27 MED ORDER — ACETAMINOPHEN 500 MG PO TABS
1000.0000 mg | ORAL_TABLET | Freq: Once | ORAL | Status: AC
  Administered 2018-11-27: 08:00:00 1000 mg via ORAL
  Filled 2018-11-27: qty 2

## 2018-11-27 MED ORDER — PANTOPRAZOLE SODIUM 40 MG PO TBEC: 40.0000 mg | DELAYED_RELEASE_TABLET | Freq: Every day | ORAL | Status: DC | PRN

## 2018-11-27 MED ORDER — ONDANSETRON HCL 4 MG/2ML IJ SOLN: 4.0000 mg | Freq: Once | INTRAMUSCULAR | Status: DC | PRN

## 2018-11-27 MED ORDER — LEVOTHYROXINE SODIUM 50 MCG PO TABS
50.0000 ug | ORAL_TABLET | Freq: Every day | ORAL | Status: DC
  Administered 2018-11-28: 50 ug via ORAL
  Filled 2018-11-27: qty 1

## 2018-11-27 MED ORDER — LIDOCAINE 2% (20 MG/ML) 5 ML SYRINGE
INTRAMUSCULAR | Status: AC
  Filled 2018-11-27: qty 5

## 2018-11-27 MED ORDER — CHOLESTYRAMINE 4 G PO PACK
4.0000 g | PACK | Freq: Two times a day (BID) | ORAL | Status: DC | PRN
  Filled 2018-11-27: qty 1

## 2018-11-27 MED ORDER — FENTANYL CITRATE (PF) 250 MCG/5ML IJ SOLN
INTRAMUSCULAR | Status: AC
  Filled 2018-11-27: qty 5

## 2018-11-27 MED ORDER — ONDANSETRON HCL 4 MG/2ML IJ SOLN
INTRAMUSCULAR | Status: AC
  Filled 2018-11-27: qty 2

## 2018-11-27 MED ORDER — FENTANYL CITRATE (PF) 100 MCG/2ML IJ SOLN: 25.0000 ug | INTRAMUSCULAR | Status: DC | PRN

## 2018-11-27 MED ORDER — DEXAMETHASONE SODIUM PHOSPHATE 10 MG/ML IJ SOLN
INTRAMUSCULAR | Status: AC
  Filled 2018-11-27: qty 1

## 2018-11-27 MED ORDER — LIDOCAINE-EPINEPHRINE 1 %-1:100000 IJ SOLN
INTRAMUSCULAR | Status: AC
  Filled 2018-11-27: qty 1

## 2018-11-27 MED ORDER — LIDOCAINE HCL (CARDIAC) PF 100 MG/5ML IV SOSY
PREFILLED_SYRINGE | INTRAVENOUS | Status: DC | PRN
  Administered 2018-11-27: 3 mL via INTRATRACHEAL

## 2018-11-27 MED ORDER — FENTANYL CITRATE (PF) 100 MCG/2ML IJ SOLN
INTRAMUSCULAR | Status: DC | PRN
  Administered 2018-11-27 (×3): 50 ug via INTRAVENOUS

## 2018-11-27 MED ORDER — KCL IN DEXTROSE-NACL 20-5-0.45 MEQ/L-%-% IV SOLN
INTRAVENOUS | Status: DC
  Administered 2018-11-27: 13:00:00 via INTRAVENOUS
  Filled 2018-11-27: qty 1000

## 2018-11-27 MED ORDER — AMITRIPTYLINE HCL 25 MG PO TABS
25.0000 mg | ORAL_TABLET | Freq: Every day | ORAL | Status: DC
  Administered 2018-11-28: 25 mg via ORAL
  Filled 2018-11-27: qty 1

## 2018-11-27 MED ORDER — LACTATED RINGERS IV SOLN
INTRAVENOUS | Status: DC
  Administered 2018-11-27: 08:00:00 via INTRAVENOUS

## 2018-11-27 MED ORDER — CALCIUM CARBONATE-VITAMIN D 500-200 MG-UNIT PO TABS
2.0000 | ORAL_TABLET | Freq: Two times a day (BID) | ORAL | Status: DC
  Administered 2018-11-27 (×2): 2 via ORAL
  Filled 2018-11-27 (×3): qty 2

## 2018-11-27 MED ORDER — FLUOXETINE HCL 10 MG PO CAPS
10.0000 mg | ORAL_CAPSULE | Freq: Every day | ORAL | Status: DC
  Administered 2018-11-27: 10 mg via ORAL
  Filled 2018-11-27 (×2): qty 1

## 2018-11-27 MED ORDER — SUCCINYLCHOLINE CHLORIDE 200 MG/10ML IV SOSY
PREFILLED_SYRINGE | INTRAVENOUS | Status: AC
  Filled 2018-11-27: qty 10

## 2018-11-27 MED ORDER — LIDOCAINE-EPINEPHRINE 1 %-1:100000 IJ SOLN
INTRAMUSCULAR | Status: DC | PRN
  Administered 2018-11-27: 20 mL

## 2018-11-27 MED ORDER — SUCCINYLCHOLINE CHLORIDE 20 MG/ML IJ SOLN
INTRAMUSCULAR | Status: DC | PRN
  Administered 2018-11-27: 80 mg via INTRAVENOUS

## 2018-11-27 MED ORDER — STERILE WATER FOR IRRIGATION IR SOLN
Status: DC | PRN
  Administered 2018-11-27: 200 mL

## 2018-11-27 MED ORDER — CEFAZOLIN SODIUM-DEXTROSE 2-3 GM-%(50ML) IV SOLR
INTRAVENOUS | Status: DC | PRN
  Administered 2018-11-27: 2 g via INTRAVENOUS

## 2018-11-27 MED ORDER — PROPOFOL 10 MG/ML IV BOLUS
INTRAVENOUS | Status: DC | PRN
  Administered 2018-11-27: 30 mg via INTRAVENOUS
  Administered 2018-11-27: 20 mg via INTRAVENOUS
  Administered 2018-11-27: 100 mg via INTRAVENOUS

## 2018-11-27 MED ORDER — OXYCODONE-ACETAMINOPHEN 5-325 MG PO TABS: 1.0000 | ORAL_TABLET | ORAL | Status: DC | PRN

## 2018-11-27 MED ORDER — PHENYLEPHRINE 40 MCG/ML (10ML) SYRINGE FOR IV PUSH (FOR BLOOD PRESSURE SUPPORT)
PREFILLED_SYRINGE | INTRAVENOUS | Status: DC | PRN
  Administered 2018-11-27: 80 ug via INTRAVENOUS

## 2018-11-27 MED ORDER — TRAZODONE HCL 100 MG PO TABS
100.0000 mg | ORAL_TABLET | Freq: Every day | ORAL | Status: DC
  Administered 2018-11-27: 100 mg via ORAL
  Filled 2018-11-27: qty 1

## 2018-11-27 MED ORDER — 0.9 % SODIUM CHLORIDE (POUR BTL) OPTIME
TOPICAL | Status: DC | PRN
  Administered 2018-11-27: 1000 mL

## 2018-11-27 MED ORDER — MORPHINE SULFATE (PF) 2 MG/ML IV SOLN: 2.0000 mg | INTRAVENOUS | Status: DC | PRN

## 2018-11-27 SURGICAL SUPPLY — 51 items
ATTRACTOMAT 16X20 MAGNETIC DRP (DRAPES) IMPLANT
BLADE CLIPPER SURG (BLADE) IMPLANT
BLADE SURG 15 STRL LF DISP TIS (BLADE) IMPLANT
BLADE SURG 15 STRL SS (BLADE)
CANISTER SUCT 3000ML PPV (MISCELLANEOUS) IMPLANT
CLEANER TIP ELECTROSURG 2X2 (MISCELLANEOUS) ×3 IMPLANT
CLIP VESOCCLUDE SM WIDE 24/CT (CLIP) IMPLANT
CONT SPEC 4OZ CLIKSEAL STRL BL (MISCELLANEOUS) ×3 IMPLANT
CORD BIPOLAR FORCEPS 12FT (ELECTRODE) ×3 IMPLANT
COVER SURGICAL LIGHT HANDLE (MISCELLANEOUS) ×3 IMPLANT
COVER WAND RF STERILE (DRAPES) IMPLANT
DERMABOND ADVANCED (GAUZE/BANDAGES/DRESSINGS) ×2
DERMABOND ADVANCED .7 DNX12 (GAUZE/BANDAGES/DRESSINGS) ×1 IMPLANT
DRAIN CHANNEL 10F 3/8 F FF (DRAIN) ×3 IMPLANT
DRAPE HALF SHEET 40X57 (DRAPES) ×3 IMPLANT
ELECT COATED BLADE 2.86 ST (ELECTRODE) IMPLANT
ELECT REM PT RETURN 9FT ADLT (ELECTROSURGICAL) ×3
ELECTRODE REM PT RTRN 9FT ADLT (ELECTROSURGICAL) ×1 IMPLANT
EVACUATOR SILICONE 100CC (DRAIN) ×3 IMPLANT
FORCEPS BIPOLAR SPETZLER 8 1.0 (NEUROSURGERY SUPPLIES) ×3 IMPLANT
GAUZE 4X4 16PLY RFD (DISPOSABLE) ×3 IMPLANT
GLOVE BIO SURGEON STRL SZ 6.5 (GLOVE) ×2 IMPLANT
GLOVE BIO SURGEONS STRL SZ 6.5 (GLOVE) ×1
GLOVE BIOGEL PI IND STRL 6.5 (GLOVE) ×2 IMPLANT
GLOVE BIOGEL PI IND STRL 7.0 (GLOVE) ×1 IMPLANT
GLOVE BIOGEL PI INDICATOR 6.5 (GLOVE) ×4
GLOVE BIOGEL PI INDICATOR 7.0 (GLOVE) ×2
GLOVE ECLIPSE 7.5 STRL STRAW (GLOVE) ×3 IMPLANT
GLOVE SURG SS PI 6.5 STRL IVOR (GLOVE) ×3 IMPLANT
GOWN STRL REUS W/ TWL LRG LVL3 (GOWN DISPOSABLE) ×3 IMPLANT
GOWN STRL REUS W/TWL LRG LVL3 (GOWN DISPOSABLE) ×6
HEMOSTAT SURGICEL 2X14 (HEMOSTASIS) IMPLANT
KIT BASIN OR (CUSTOM PROCEDURE TRAY) ×3 IMPLANT
KIT TURNOVER KIT B (KITS) ×3 IMPLANT
NEEDLE HYPO 25GX1X1/2 BEV (NEEDLE) ×3 IMPLANT
NS IRRIG 1000ML POUR BTL (IV SOLUTION) ×3 IMPLANT
PAD ARMBOARD 7.5X6 YLW CONV (MISCELLANEOUS) ×6 IMPLANT
PENCIL BUTTON HOLSTER BLD 10FT (ELECTRODE) ×3 IMPLANT
PENCIL SMOKE EVACUATOR (MISCELLANEOUS) ×3 IMPLANT
POSITIONER HEAD DONUT 9IN (MISCELLANEOUS) ×3 IMPLANT
PROBE NERVBE PRASS .33 (MISCELLANEOUS) ×3 IMPLANT
SHEARS HARMONIC 9CM CVD (BLADE) ×3 IMPLANT
SOL PREP POV-IOD 4OZ 10% (MISCELLANEOUS) ×3 IMPLANT
SPONGE INTESTINAL PEANUT (DISPOSABLE) ×3 IMPLANT
SUT ETHILON 2 0 FS 18 (SUTURE) ×3 IMPLANT
SUT SILK 2 0 PERMA HAND 18 BK (SUTURE) ×3 IMPLANT
SUT SILK 3 0 REEL (SUTURE) ×3 IMPLANT
SUT VICRYL 4-0 PS2 18IN ABS (SUTURE) ×6 IMPLANT
TRAY ENT MC OR (CUSTOM PROCEDURE TRAY) ×3 IMPLANT
TRAY FOLEY MTR SLVR 14FR STAT (SET/KITS/TRAYS/PACK) IMPLANT
TUBE ENDOTRAC EMG 7X10.2 (MISCELLANEOUS) ×3 IMPLANT

## 2018-11-27 NOTE — Interval H&P Note (Signed)
History and Physical Interval Note:  11/27/2018 8:26 AM  Lacey Ramsey  has presented today for surgery, with the diagnosis of thyroid cancer.  The various methods of treatment have been discussed with the patient and family. After consideration of risks, benefits and other options for treatment, the patient has consented to  Procedure(s): COMPLETION THYROIDECTOMY (N/A) as a surgical intervention.  The patient's history has been reviewed, patient examined, no change in status, stable for surgery.  I have reviewed the patient's chart and labs.  Questions were answered to the patient's satisfaction.     Caral Whan Cassie Freer

## 2018-11-27 NOTE — Anesthesia Preprocedure Evaluation (Signed)
Anesthesia Evaluation  Patient identified by MRN, date of birth, ID band Patient awake    Reviewed: Allergy & Precautions, NPO status , Patient's Chart, lab work & pertinent test results  History of Anesthesia Complications Negative for: history of anesthetic complications  Airway Mallampati: II  TM Distance: >3 FB Neck ROM: Full    Dental  (+) Dental Advisory Given, Chipped   Pulmonary neg pulmonary ROS,    Pulmonary exam normal breath sounds clear to auscultation       Cardiovascular negative cardio ROS Normal cardiovascular exam Rhythm:Regular Rate:Normal     Neuro/Psych PSYCHIATRIC DISORDERS Anxiety negative neurological ROS     GI/Hepatic Neg liver ROS, hiatal hernia, GERD  ,  Endo/Other  Hypothyroidism   Renal/GU negative Renal ROS     Musculoskeletal negative musculoskeletal ROS (+)   Abdominal   Peds  Hematology negative hematology ROS (+)   Anesthesia Other Findings Day of surgery medications reviewed with the patient.  Reproductive/Obstetrics                             Anesthesia Physical Anesthesia Plan  ASA: II  Anesthesia Plan: General   Post-op Pain Management:    Induction: Intravenous  PONV Risk Score and Plan: 4 or greater and Midazolam, Dexamethasone, Ondansetron and Diphenhydramine  Airway Management Planned: Oral ETT  Additional Equipment:   Intra-op Plan:   Post-operative Plan: Extubation in OR  Informed Consent: I have reviewed the patients History and Physical, chart, labs and discussed the procedure including the risks, benefits and alternatives for the proposed anesthesia with the patient or authorized representative who has indicated his/her understanding and acceptance.     Dental advisory given  Plan Discussed with: CRNA  Anesthesia Plan Comments:         Anesthesia Quick Evaluation

## 2018-11-27 NOTE — Anesthesia Postprocedure Evaluation (Signed)
Anesthesia Post Note  Patient: Lacey Ramsey  Procedure(s) Performed: COMPLETION THYROIDECTOMY (N/A Neck)     Patient location during evaluation: PACU Anesthesia Type: General Level of consciousness: awake and alert Pain management: pain level controlled Vital Signs Assessment: post-procedure vital signs reviewed and stable Respiratory status: spontaneous breathing, nonlabored ventilation, respiratory function stable and patient connected to nasal cannula oxygen Cardiovascular status: blood pressure returned to baseline and stable Postop Assessment: no apparent nausea or vomiting Anesthetic complications: no    Last Vitals:  Vitals:   11/27/18 1126 11/27/18 1146  BP: (!) 147/74 (!) 142/65  Pulse:  69  Resp:  18  Temp:  36.6 C  SpO2:  98%    Last Pain:  Vitals:   11/27/18 1146  TempSrc: Oral  PainSc:                  Catalina Gravel

## 2018-11-27 NOTE — Op Note (Signed)
DATE OF PROCEDURE:  11/27/2018                              OPERATIVE REPORT  SURGEON:  Leta Baptist, MD  ASSISTANT: Gae Gallop  PREOPERATIVE DIAGNOSES: 1. Papillary thyroid carcinoma  POSTOPERATIVE DIAGNOSES: 1. Papillary thyroid carcinoma  PROCEDURE PERFORMED: Completion thyroidectomy  ANESTHESIA:  General endotracheal tube anesthesia.  COMPLICATIONS:  None.  ESTIMATED BLOOD LOSS:  20 ml  INDICATION FOR PROCEDURE:  Lacey Ramsey is a 68 y.o. female with a history of multinodular thyroid goiter.  The patient was previously evaluated with fine-needle aspiration biopsy of her nodules.  Her left thyroid nodule was noted to be benign.  However, the right nodule was noted to have cellular atypia.  She underwent right hemithyroidectomy surgery earlier this week.  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  Based on the above findings, the decision was made for the patient to undergo the completion thyroidectomy procedure. The risks, benefits, alternatives, and details of the procedure were discussed with the patient.  Questions were invited and answered.  Informed consent was obtained.  DESCRIPTION: The patient was taken to the operating room and placed supine on the operating table.  General endotracheal tube anesthesia was administered by the anesthesiologist.  The patient was positioned and prepped and draped in a standard fashion for thyroidectomy surgery. The NIMS nerve monitoring endotracheal tube was used.  Nerve monitoring system was functional throughout the case.  1% lidocaine with 1-100,000 epinephrine was infiltrated at the planned site of incision.  A lower neck transverse incision was made, incorporating the previous incision.  The incision was made carried down past the level of the platysma muscles.  Superiorly and inferiorly based subplatysmal flaps were elevated in the standard fashion.  The strap muscles were divided at midline and  retracted laterally, exposing the remaining left thyroid gland.  The left thyroid lobe was carefully dissected free from the surrounding soft tissue.  The recurrent laryngeal nerve was identified and preserved.  The entire left lobe was sent to the pathology department for permanent histologic identification.  2 parathyroid glands were identified and preserved.  The surgical site was copiously irrigated.  A #10 JP drain was placed.  The strap muscles were reapproximated with 4-0 Vicryl sutures.  The incision was closed in layers with 4-0 Vicryl and Dermabond.  The care of the patient was turned over to the anesthesiologist.  The patient was awakened from anesthesia without difficulty.  The patient was extubated and transferred to the recovery room in good condition.  OPERATIVE FINDINGS: The remaining left thyroid lobe was removed.  SPECIMEN: The left thyroid lobe.  FOLLOWUP CARE:  The patient will be observed overnight in the hospital.  Her calcium level will be monitored.  She will be discharged home once her calcium level is stable.  Autumn Gunn W Elza Sortor 11/27/2018 10:31 AM

## 2018-11-27 NOTE — Anesthesia Procedure Notes (Signed)
Procedure Name: Intubation Date/Time: 11/27/2018 9:25 AM Performed by: Alain Marion, CRNA Pre-anesthesia Checklist: Patient identified, Emergency Drugs available, Suction available and Patient being monitored Patient Re-evaluated:Patient Re-evaluated prior to induction Oxygen Delivery Method: Circle System Utilized Preoxygenation: Pre-oxygenation with 100% oxygen Induction Type: IV induction and Rapid sequence Laryngoscope Size: 2 and Miller Grade View: Grade I Tube type: Reinforced Tube size: 7.0 mm Number of attempts: 1 Airway Equipment and Method: Stylet Placement Confirmation: ETT inserted through vocal cords under direct vision,  positive ETCO2 and breath sounds checked- equal and bilateral Secured at: 17 cm Tube secured with: Tape Dental Injury: Teeth and Oropharynx as per pre-operative assessment

## 2018-11-27 NOTE — Discharge Instructions (Signed)
Thyroidectomy, Care After This sheet gives you information about how to care for yourself after your procedure. Your health care provider may also give you more specific instructions. If you have problems or questions, contact your health care provider. What can I expect after the procedure? After the procedure, it is common to have:  Mild pain in the neck or upper body, especially when swallowing.  A swollen neck.  A sore throat.  A weak or hoarse voice.  Slight tingling or numbness around your mouth, or in your fingers or toes. This may last for a day or two after surgery. This condition is caused by low levels of calcium. You may be given calcium supplements to treat it. Follow these instructions at home:  Medicines  Take over-the-counter and prescription medicines only as told by your health care provider.  Do not drive or use heavy machinery while taking prescription pain medicine.  Do not take medicines that contain aspirin and ibuprofen until your health care provider says that you can. These medicines can increase your risk of bleeding.  Take a thyroid hormone medicine as recommended by your health care provider. You will have to take this medicine for the rest of your life if your entire thyroid was removed. Eating and drinking  Start slowly with eating. You may need to have only liquids and soft foods for a few days or as directed by your health care provider.  To prevent or treat constipation while you are taking prescription pain medicine, your health care provider may recommend that you: ? Drink enough fluid to keep your urine pale yellow. ? Take over-the-counter or prescription medicines. ? Eat foods that are high in fiber, such as fresh fruits and vegetables, whole grains, and beans. ? Limit foods that are high in fat and processed sugars, such as fried and sweet foods. Incision care  Follow instructions from your health care provider about how to take care of your  incision. Make sure you: ? Leave stitches (sutures), skin glue, or adhesive strips in place. These skin closures may need to stay in place for 2 weeks or longer. If adhesive strip edges start to loosen and curl up, you may trim the loose edges. Do not remove adhesive strips completely unless your health care provider tells you to do that.  Check your incision area every day for signs of infection. Check for: ? Redness, swelling, or pain. ? Fluid or blood. ? Warmth. ? Pus or a bad smell. Activity  For the first 10 days after the procedure or as instructed by your health care provider: ? Do not lift anything that is heavier than 10 lb (4.5 kg). ? Do not jog, swim, or do other strenuous exercises. ? Do not play contact sports.  Avoid sitting for a long time without moving. Get up to take short walks every 1-2 hours. This is needed to improve blood flow and breathing. Ask for help if you feel weak or unsteady.  Return to your normal activities as told by your health care provider. Ask your health care provider what activities are safe for you. General instructions  Do not use any products that contain nicotine or tobacco, such as cigarettes and e-cigarettes. These can delay healing after surgery. If you need help quitting, ask your health care provider.  Keep all follow-up visits as told by your health care provider. This is important. Your health care provider needs to monitor the calcium level in your blood to make sure that it  does not become low. Contact a health care provider if you:  Have a fever.  Have more redness, swelling, or pain around your incision area.  Have fluid or blood coming from your incision area.  Notice that your incision area feels warm to the touch.  Have pus or a bad smell coming from your incision area.  Have trouble talking.  Have nausea or vomiting for more than 2 days. Get help right away if you:  Have trouble breathing.  Have trouble  swallowing.  Develop a rash.  Develop a cough that gets worse.  Notice that your speech changes, or you have hoarseness that gets worse.  Develop numbness, tingling, or muscle spasms in the arms, hands, feet, or face. Summary  After the procedure, it is common to feel mild pain in the neck or upper body, especially when swallowing.  Take medicines as told by your health care provider. These include pain medicines and thyroid hormones, if required.  Follow instructions from your health care provider about how to take care of your incision. Watch for signs of infection.  Keep all follow-up visits as told by your health care provider. This is important. Your health care provider needs to monitor the calcium level in your blood to make sure that it does not become low.  Get help right away if you develop difficulty breathing, or numbness, tingling, or muscle spasms in the arms, hands, feet, or face. This information is not intended to replace advice given to you by your health care provider. Make sure you discuss any questions you have with your health care provider. Document Released: 11/30/2004 Document Revised: 07/09/2018 Document Reviewed: 03/18/2017 Elsevier Patient Education  2020 [employer].

## 2018-11-27 NOTE — Transfer of Care (Signed)
Immediate Anesthesia Transfer of Care Note  Patient: Lacey Ramsey  Procedure(s) Performed: COMPLETION THYROIDECTOMY (N/A Neck)  Patient Location: PACU  Anesthesia Type:General  Level of Consciousness: awake, alert  and oriented  Airway & Oxygen Therapy: Patient Spontanous Breathing and Patient connected to face mask oxygen  Post-op Assessment: Report given to RN and Post -op Vital signs reviewed and stable  Post vital signs: Reviewed and stable  Last Vitals:  Vitals Value Taken Time  BP 143/87   Temp    Pulse 86   Resp 10   SpO2 97%     Last Pain:  Vitals:   11/27/18 0738  PainSc: 0-No pain      Patients Stated Pain Goal: 0 (30/74/60 0298)  Complications: No apparent anesthesia complications

## 2018-11-28 ENCOUNTER — Encounter (HOSPITAL_COMMUNITY): Payer: Self-pay | Admitting: Otolaryngology

## 2018-11-28 DIAGNOSIS — F419 Anxiety disorder, unspecified: Secondary | ICD-10-CM | POA: Diagnosis not present

## 2018-11-28 DIAGNOSIS — C73 Malignant neoplasm of thyroid gland: Secondary | ICD-10-CM | POA: Diagnosis not present

## 2018-11-28 DIAGNOSIS — E063 Autoimmune thyroiditis: Secondary | ICD-10-CM | POA: Diagnosis not present

## 2018-11-28 DIAGNOSIS — K219 Gastro-esophageal reflux disease without esophagitis: Secondary | ICD-10-CM | POA: Diagnosis not present

## 2018-11-28 DIAGNOSIS — E039 Hypothyroidism, unspecified: Secondary | ICD-10-CM | POA: Diagnosis not present

## 2018-11-28 DIAGNOSIS — Z1159 Encounter for screening for other viral diseases: Secondary | ICD-10-CM | POA: Diagnosis not present

## 2018-11-28 LAB — CALCIUM
Calcium: 8.5 mg/dL — ABNORMAL LOW (ref 8.9–10.3)
Calcium: 8.7 mg/dL — ABNORMAL LOW (ref 8.9–10.3)

## 2018-11-28 MED ORDER — CALCIUM CARBONATE-VITAMIN D 500-200 MG-UNIT PO TABS: 1.0000 | ORAL_TABLET | Freq: Two times a day (BID) | ORAL | 3 refills | Status: DC

## 2018-11-28 MED ORDER — LEVOTHYROXINE SODIUM 100 MCG PO TABS: 100.0000 ug | ORAL_TABLET | Freq: Every day | ORAL | 10 refills | Status: DC

## 2018-11-28 NOTE — Discharge Summary (Signed)
Physician Discharge Summary  Patient ID: Lacey Ramsey MRN: [id number] DOB/AGE: [birth year] 68 y.o.  Admit date: 11/27/2018 Discharge date: 11/28/2018  Admission Diagnoses: Thyroid cancer  Discharge Diagnoses: Thyroid cancer Active Problems:   S/P complete thyroidectomy   Discharged Condition: good  Hospital Course: Pt had an uneventful overnight stay. Pt tolerated po well. No bleeding. No stridor. Voice is strong.  Consults: None  Significant Diagnostic Studies: None  Treatments: surgery: Completion thyroidectomy  Discharge Exam: Blood pressure 125/60, pulse 75, temperature 98.1 F (36.7 C), temperature source Oral, resp. rate 16, height 5\' 2"  (1.575 m), weight 58.3 kg, SpO2 100 %. Incision/Wound:c/d/i Voice is strong  Disposition: Discharge disposition: 01-Home or Self Care       Discharge Instructions    Activity as tolerated - No restrictions   Complete by: As directed    Diet general   Complete by: As directed      Allergies as of 11/28/2018      Reactions   Codeine Nausea And Vomiting   Other Other (See Comments)   Surgical glue-itchy rash      Medication List    TAKE these medications   amitriptyline 25 MG tablet Commonly known as: ELAVIL Take 25 mg by mouth daily.   amoxicillin 875 MG tablet Commonly known as: AMOXIL Take 1 tablet (875 mg total) by mouth 2 (two) times daily for 5 days.   calcium-vitamin D 500-200 MG-UNIT tablet Commonly known as: OSCAL WITH D Take 1 tablet by mouth 2 (two) times daily.   cholestyramine 4 g packet Commonly known as: QUESTRAN Take 1 packet (4 g total) by mouth 2 (two) times daily. What changed:   when to take this  reasons to take this   FLUoxetine 10 MG tablet Commonly known as: PROZAC Take 10 mg by mouth at bedtime.   levothyroxine 100 MCG tablet Commonly known as: SYNTHROID Take 1 tablet (100 mcg total) by mouth daily before breakfast. What changed:   medication strength  how much to take    loperamide 2 MG capsule Commonly known as: IMODIUM Take 1 capsule (2 mg total) by mouth as needed for diarrhea or loose stools. What changed: how much to take   oxyCODONE-acetaminophen 5-325 MG tablet Commonly known as: Percocet Take 1 tablet by mouth every 4 (four) hours as needed for up to 4 days for severe pain.   pantoprazole 40 MG tablet Commonly known as: Protonix Take 1 tablet (40 mg total) by mouth 2 (two) times daily before a meal. What changed:   when to take this  reasons to take this   traZODone 100 MG tablet Commonly known as: DESYREL Take 100 mg by mouth at bedtime.      Follow-up Information    Leta Baptist, MD On 12/03/2018.   Specialty: Otolaryngology Why: at [facility] [employer] information: [address] [place] [address] [phone]           Signed: Burley Saver 11/28/2018, 1:17 PM

## 2018-11-28 NOTE — Progress Notes (Signed)
Subjective: No issues overnight.  Objective: Vital signs in last 24 hours: Temp:  [97.9 F (36.6 C)-98.2 F (36.8 C)] 98.1 F (36.7 C) (07/04 0942) Pulse Rate:  [68-82] 75 (07/04 0942) Resp:  [14-18] 16 (07/04 0942) BP: (100-142)/(60-68) 125/60 (07/04 0942) SpO2:  [96 %-100 %] 100 % (07/04 0942) Weight:  [58.3 kg] 58.3 kg (07/03 1146)  Incision/Wound: c/d/i Voice is strong  Recent Labs    11/27/18 0716  WBC 4.8  HGB 12.6  HCT 39.8  PLT 177   Recent Labs    11/27/18 0716  11/27/18 2108 11/28/18 0448  NA 140  --   --   --   K 4.3  --   --   --   CL 106  --   --   --   CO2 22  --   --   --   GLUCOSE 75  --   --   --   BUN 15  --   --   --   CREATININE 0.85  --   --   --   CALCIUM 9.2   < > 8.5* 8.5*   < > = values in this interval not displayed.    Medications:  I have reviewed the patient's current medications. Scheduled: . amitriptyline  25 mg Oral Daily  . calcium-vitamin D  2 tablet Oral BID  . FLUoxetine  10 mg Oral QHS  . levothyroxine  50 mcg Oral QAC breakfast  . traZODone  100 mg Oral QHS   Continuous: . dextrose 5 % and 0.45 % NaCl with KCl 20 mEq/L 10 mL/hr at 11/28/18 0500    Assessment/Plan: POD #1 s/p completion thyroidectomy. Calcium level is stabilizing at 8.5. - JP removed. - Will d/c home if Calcium level continues to be stable.   LOS: 1 day   Cambrey Lupi W Kadin Canipe 11/28/2018, 11:32 AM

## 2018-12-03 ENCOUNTER — Ambulatory Visit (INDEPENDENT_AMBULATORY_CARE_PROVIDER_SITE_OTHER): Payer: Medicare Other | Admitting: Otolaryngology

## 2018-12-03 DIAGNOSIS — C73 Malignant neoplasm of thyroid gland: Secondary | ICD-10-CM | POA: Diagnosis not present

## 2018-12-30 ENCOUNTER — Encounter: Payer: Self-pay | Admitting: "Endocrinology

## 2018-12-30 ENCOUNTER — Other Ambulatory Visit: Payer: Self-pay

## 2018-12-30 ENCOUNTER — Ambulatory Visit (INDEPENDENT_AMBULATORY_CARE_PROVIDER_SITE_OTHER): Payer: Medicare Other | Admitting: "Endocrinology

## 2018-12-30 VITALS — BP 130/79 | HR 61 | Ht 62.0 in | Wt 127.0 lb

## 2018-12-30 DIAGNOSIS — C73 Malignant neoplasm of thyroid gland: Secondary | ICD-10-CM | POA: Diagnosis not present

## 2018-12-30 DIAGNOSIS — E890 Postprocedural hypothyroidism: Secondary | ICD-10-CM | POA: Diagnosis not present

## 2018-12-30 NOTE — Progress Notes (Signed)
Endocrinology Consult Note                                            12/30/2018, 6:29 PM   Subjective:    Patient ID: Lacey Ramsey, female    DOB: 1951/05/02, PCP Renne Crigler, NP   Past Medical History:  Diagnosis Date  . Anxiety   . Cancer ([place])    Thyroid   . GERD (gastroesophageal reflux disease)   . Hypothyroidism   . IBS (irritable bowel syndrome)   . Insomnia   . Thyroid mass    right   Past Surgical History:  Procedure Laterality Date  . APPENDECTOMY    . CHOLECYSTECTOMY N/A 05/13/2017   Procedure: LAPAROSCOPIC CHOLECYSTECTOMY;  Surgeon: Olean Ree, MD;  Location: [facility] ORS;  Service: General;  Laterality: N/A;  . ERCP N/A 11/17/2017   Procedure: ENDOSCOPIC RETROGRADE CHOLANGIOPANCREATOGRAPHY (ERCP);  Surgeon: Lucilla Lame, MD;  Location: [facility] ENDOSCOPY;  Service: Endoscopy;  Laterality: N/A;  . HERNIA REPAIR    . KNEE ARTHROSCOPY    . THYROIDECTOMY Right 11/24/2018   Procedure: RIGHT HEMI THYROIDECTOMY;  Surgeon: Leta Baptist, MD;  Location: [facility];  Service: ENT;  Laterality: Right;  . THYROIDECTOMY Left 11/27/2018  . THYROIDECTOMY N/A 11/27/2018   Procedure: COMPLETION THYROIDECTOMY;  Surgeon: Leta Baptist, MD;  Location: [facility] OR;  Service: ENT;  Laterality: N/A;  . TONSILLECTOMY    . TUBAL LIGATION     Social History   Socioeconomic History  . Marital status: Married    Spouse name: Not on file  . Number of children: Not on file  . Years of education: Not on file  . Highest education level: Not on file  Occupational History  . Not on file  Social Needs  . Financial resource strain: Not on file  . Food insecurity    Worry: Not on file    Inability: Not on file  . Transportation needs    Medical: Not on file    Non-medical: Not on file  Tobacco Use  . Smoking status: Never Smoker  . Smokeless tobacco: Never Used  Substance and Sexual Activity  . Alcohol use: Yes    Comment: social  . Drug use: No  . Sexual activity: Yes     Birth control/protection: Post-menopausal  Lifestyle  . Physical activity    Days per week: Not on file    Minutes per session: Not on file  . Stress: Not on file  Relationships  . Social Herbalist on phone: Not on file    Gets together: Not on file    Attends religious service: Not on file    Active member of club or organization: Not on file    Attends meetings of clubs or organizations: Not on file    Relationship status: Not on file  Other Topics Concern  . Not on file  Social History Narrative  . Not on file   Family History  Problem Relation Age of Onset  . Cancer Mother   . Breast cancer Sister   . Aneurysm Father   . Heart attack Paternal Grandfather   . Heart attack Paternal Grandmother   . Cancer Maternal Grandfather        skin  . Suicidality Daughter   . Alcohol abuse Daughter    Outpatient Encounter  Medications as of 12/30/2018  Medication Sig  . amitriptyline (ELAVIL) 25 MG tablet Take 25 mg by mouth daily.   . calcium-vitamin D (OSCAL WITH D) 500-200 MG-UNIT tablet Take 1 tablet by mouth 2 (two) times daily.  Marland Kitchen FLUoxetine (PROZAC) 10 MG tablet Take 10 mg by mouth at bedtime.   Marland Kitchen levothyroxine (EUTHYROX) 100 MCG tablet Take 100 mcg by mouth daily before breakfast.  . loperamide (IMODIUM) 2 MG capsule Take 1 capsule (2 mg total) by mouth as needed for diarrhea or loose stools. (Patient taking differently: Take 1 mg by mouth as needed for diarrhea or loose stools. )  . pantoprazole (PROTONIX) 40 MG tablet Take 1 tablet (40 mg total) by mouth 2 (two) times daily before a meal. (Patient taking differently: Take 40 mg by mouth daily as needed (for acid reflux). )  . Probiotic Product (PROBIOTIC-10 PO) Take by mouth daily.  . traZODone (DESYREL) 100 MG tablet Take 100 mg by mouth at bedtime.   . vitamin B-12 (CYANOCOBALAMIN) 500 MCG tablet Take 500 mcg by mouth daily.  . cholestyramine (QUESTRAN) 4 g packet Take 1 packet (4 g total) by mouth 2 (two)  times daily. (Patient taking differently: Take 4 g by mouth 2 (two) times daily as needed (for stomach). )  . [DISCONTINUED] levothyroxine (SYNTHROID) 100 MCG tablet Take 1 tablet (100 mcg total) by mouth daily before breakfast.   No facility-administered encounter medications on file as of 12/30/2018.    ALLERGIES: Allergies  Allergen Reactions  . Codeine Nausea And Vomiting  . Other Other (See Comments)    Surgical glue-itchy rash    VACCINATION STATUS: Immunization History  Administered Date(s) Administered  . Influenza, High Dose Seasonal PF 03/24/2017  . Tdap 12/08/2017    HPI Lacey Ramsey is 68 y.o. female who presents today with a medical history as above. 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 She underwent clinical exam followed by thyroid ultrasound and fine-needle aspiration of a thyroid nodule in June 2020 which revealed malignancy. The original work-up in biopsy reports are not available to review.  She underwent right thyroid lobectomy on November 24, 2018 which revealed 2.1 cm papillary thyroid cancer. 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 She has recovered very well from her surgery.  She is currently on thyroid hormone supplement with Euthyrox 100 mcg p.o. nightly.  She is compliant.  She denies dysphagia, shortness of breath, voice change. She has family history of various thyroid dysfunction, including hypothyroidism in her sister, no family history of thyroid malignancy.   Review of Systems  Constitutional: + steady  weight , no fatigue, no subjective hyperthermia, no subjective hypothermia Eyes: no blurry vision, no xerophthalmia ENT: no sore throat, no nodules palpated in throat, no dysphagia/odynophagia, no hoarseness Cardiovascular: no Chest Pain, no Shortness  of Breath, no palpitations, no leg swelling Respiratory: no cough, no shortness of breath Gastrointestinal: no Nausea/Vomiting/Diarhhea Musculoskeletal: no muscle/joint aches Skin: no rashes Neurological: no tremors, no numbness, no tingling, no dizziness Psychiatric: no depression, no anxiety  Objective:    BP 130/79   Pulse 61   Ht 5\' 2"  (1.575 m)   Wt 127 lb (57.6 kg)   BMI 23.23 kg/m   Wt Readings from Last 3 Encounters:  12/30/18 127 lb (57.6 kg)  11/27/18 128 lb 9.6 oz (58.3 kg)  11/24/18 127 lb 13.9 oz (58 kg)    Physical Exam  Constitutional: + Appropriate weight for height, not in acute distress, normal state of mind Eyes: PERRLA, EOMI, no exophthalmos ENT: moist mucous membranes, + well-healed post thyroidectomy surgical scar on anterior lower neck, no gross cervical lymphadenopathy Cardiovascular: normal precordial activity, Regular Rate and Rhythm, no Murmur/Rubs/Gallops Respiratory:  adequate breathing efforts, no gross chest deformity, Clear to auscultation bilaterally Gastrointestinal: abdomen soft, Non -tender, No distension, Bowel Sounds present, no gross organomegaly Musculoskeletal: no gross deformities, strength intact in all four extremities Skin: moist, warm, no rashes Neurological: no tremor with outstretched hands, Deep tendon reflexes normal in bilateral lower extremities.  CMP ( most recent) CMP     Component Value Date/Time   NA 140 11/27/2018 0716   NA 142 03/11/2018 1220   K 4.3 11/27/2018 0716   CL 106 11/27/2018 0716   CO2 22 11/27/2018 0716   GLUCOSE 75 11/27/2018 0716   BUN 15 11/27/2018 0716   BUN 7 (L) 03/11/2018 1220   CREATININE 0.85 11/27/2018 0716   CREATININE 0.89 04/27/2018 1509   CALCIUM 8.7 (L) 11/28/2018 1106   PROT 6.3 04/27/2018 1509   PROT 6.1 03/11/2018 1220   ALBUMIN 4.1 03/11/2018 1220   AST 22 04/27/2018 1509   ALT 14 04/27/2018 1509   ALKPHOS 95 03/11/2018 1220   BILITOT 0.5 04/27/2018 1509   BILITOT 0.4  03/11/2018 1220   GFRNONAA >60 11/27/2018 0716   GFRAA >60 11/27/2018 0716    November 24, 2018 right thyroid lobectomy: 2.1 cm papillary thyroid cancer [id number] November 27, 2018 left thyroid  Lobectomy: 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 Malignant neoplasm of thyroid gland ([place]) 2. Postsurgical hypothyroidism   - Retal Tonkinson  is being seen at a kind request of Weeks, Georgeanna Lea, NP. - I have reviewed her available thyroid cancer records and clinically evaluated the patient. -I have reviewed her pathology results and discussed the need for adjuvant thyroid remnant ablation with I-131 due to the fact that this was a bilateral disease, follicular variant papillary thyroid cancer, with some lymphovascular invasion on the left.   -This is deemed important to simplify your follow-up and long-term monitoring. -She will need Thyrogen stimulated I-131 thyroid remnant ablation which will be scheduled to be done as soon as possible in [facility] nuclear medicine. She will have thyroglobulin and thyroglobulin antibodies on the day of I-131 dosing. She will have post therapy for body scan before her next visit.  Regarding her postsurgical hypothyroidism, she seems to be on appropriate dose of levothyroxine.  I discussed and continued levothyroxine at 100 mcg p.o. daily before breakfast.   - We discussed about the correct intake of her thyroid hormone, on empty stomach at fasting, with water, separated by at least 30 minutes from breakfast and other medications,  and separated by more than 4 hours from calcium, iron, multivitamins, acid reflux medications (PPIs). -Patient is made aware of the fact that thyroid hormone replacement is needed for life, dose to be adjusted by periodic monitoring of thyroid function tests.  She is currently on calcium supplements, oscal, 500/200 p.o. twice daily-we will continue until labs with CMP. - I advised  her  to maintain close follow up with [facility] Georgeanna Lea, NP for primary care needs.   - Time spent with the patient: 45 minutes, of which >50% was spent in obtaining information about  her symptoms, reviewing her previous labs/studies,  evaluations, and treatments, counseling her about her thyroid cancer, postsurgical hypothyroidism, and developing a plan to confirm the diagnosis and long term treatment based on the latest standards of care/guidelines.    Jeselle Hiser participated in the discussions, expressed understanding, and voiced agreement with the above plans.  All questions were answered to her satisfaction. she is encouraged to contact clinic should she have any questions or concerns prior to her return visit.  Follow up plan: Return in about 4 weeks (around 01/27/2019) for Follow up with Whole Body Scan w/Thyrogen, Follow up with Pre-visit Labs.   Glade Lloyd, MD [facility] [address] [place], [place] [address] Phone: [phone]  Fax: [phone]     12/30/2018, 6:29 PM  This note was partially dictated with voice recognition software. Similar sounding words can be transcribed inadequately or may not  be corrected upon review.

## 2019-01-07 DIAGNOSIS — K219 Gastro-esophageal reflux disease without esophagitis: Secondary | ICD-10-CM | POA: Diagnosis not present

## 2019-01-07 DIAGNOSIS — Z0001 Encounter for general adult medical examination with abnormal findings: Secondary | ICD-10-CM | POA: Diagnosis not present

## 2019-01-07 DIAGNOSIS — E039 Hypothyroidism, unspecified: Secondary | ICD-10-CM | POA: Diagnosis not present

## 2019-01-07 DIAGNOSIS — F411 Generalized anxiety disorder: Secondary | ICD-10-CM | POA: Diagnosis not present

## 2019-01-07 DIAGNOSIS — K589 Irritable bowel syndrome without diarrhea: Secondary | ICD-10-CM | POA: Diagnosis not present

## 2019-01-07 DIAGNOSIS — Z1231 Encounter for screening mammogram for malignant neoplasm of breast: Secondary | ICD-10-CM | POA: Diagnosis not present

## 2019-01-13 ENCOUNTER — Other Ambulatory Visit: Payer: Self-pay

## 2019-01-13 ENCOUNTER — Encounter (HOSPITAL_COMMUNITY)
Admission: RE | Admit: 2019-01-13 | Discharge: 2019-01-13 | Disposition: A | Discharge status: Home / Self Care | Payer: Medicare Other | Source: Ambulatory Visit | Attending: "Endocrinology | Admitting: "Endocrinology

## 2019-01-13 DIAGNOSIS — C73 Malignant neoplasm of thyroid gland: Secondary | ICD-10-CM | POA: Insufficient documentation

## 2019-01-13 MED ORDER — THYROTROPIN ALFA 1.1 MG IM SOLR
0.9000 mg | INTRAMUSCULAR | Status: AC
  Administered 2019-01-13: 0.9 mg via INTRAMUSCULAR

## 2019-01-13 MED ORDER — THYROTROPIN ALFA 1.1 MG IM SOLR
INTRAMUSCULAR | Status: AC
  Administered 2019-01-13: 0.9 mg via INTRAMUSCULAR
  Filled 2019-01-13: qty 0.9

## 2019-01-14 ENCOUNTER — Encounter (HOSPITAL_COMMUNITY)
Admission: RE | Admit: 2019-01-14 | Discharge: 2019-01-14 | Disposition: A | Discharge status: Home / Self Care | Payer: Medicare Other | Source: Ambulatory Visit | Attending: "Endocrinology | Admitting: "Endocrinology

## 2019-01-14 ENCOUNTER — Encounter (HOSPITAL_COMMUNITY): Payer: Self-pay

## 2019-01-14 DIAGNOSIS — C73 Malignant neoplasm of thyroid gland: Secondary | ICD-10-CM | POA: Diagnosis not present

## 2019-01-14 MED ORDER — THYROTROPIN ALFA 1.1 MG IM SOLR
0.9000 mg | INTRAMUSCULAR | Status: AC
  Administered 2019-01-14: 0.9 mg via INTRAMUSCULAR

## 2019-01-15 ENCOUNTER — Other Ambulatory Visit: Payer: Self-pay

## 2019-01-15 ENCOUNTER — Encounter (HOSPITAL_COMMUNITY)
Admission: RE | Admit: 2019-01-15 | Discharge: 2019-01-15 | Disposition: A | Discharge status: Home / Self Care | Payer: Medicare Other | Source: Ambulatory Visit | Attending: "Endocrinology | Admitting: "Endocrinology

## 2019-01-15 DIAGNOSIS — C73 Malignant neoplasm of thyroid gland: Secondary | ICD-10-CM | POA: Diagnosis not present

## 2019-01-15 MED ORDER — SODIUM IODIDE I 131 CAPSULE
150.0000 | Freq: Once | INTRAVENOUS | Status: AC | PRN
  Administered 2019-01-15: 160 via ORAL

## 2019-01-21 DIAGNOSIS — E890 Postprocedural hypothyroidism: Secondary | ICD-10-CM | POA: Diagnosis not present

## 2019-01-21 DIAGNOSIS — C73 Malignant neoplasm of thyroid gland: Secondary | ICD-10-CM | POA: Diagnosis not present

## 2019-01-22 LAB — THYROGLOBULIN ANTIBODY: Thyroglobulin Ab: 7 IU/mL — ABNORMAL HIGH (ref <=1)

## 2019-01-22 LAB — T4, FREE: Free T4: 1.3 ng/dL (ref 0.8–1.8)

## 2019-01-22 LAB — TSH: TSH: 5.48 mIU/L — ABNORMAL HIGH (ref 0.40–4.50)

## 2019-01-22 LAB — THYROGLOBULIN LEVEL: Thyroglobulin: <0.1 ng/mL — ABNORMAL LOW

## 2019-01-25 ENCOUNTER — Other Ambulatory Visit: Payer: Self-pay

## 2019-01-25 ENCOUNTER — Encounter (HOSPITAL_COMMUNITY)
Admission: RE | Admit: 2019-01-25 | Discharge: 2019-01-25 | Disposition: A | Discharge status: Home / Self Care | Payer: Medicare Other | Source: Ambulatory Visit | Attending: "Endocrinology | Admitting: "Endocrinology

## 2019-01-25 DIAGNOSIS — C73 Malignant neoplasm of thyroid gland: Secondary | ICD-10-CM | POA: Diagnosis not present

## 2019-01-27 DIAGNOSIS — Z23 Encounter for immunization: Secondary | ICD-10-CM | POA: Diagnosis not present

## 2019-01-28 ENCOUNTER — Encounter: Payer: Self-pay | Admitting: "Endocrinology

## 2019-01-28 ENCOUNTER — Ambulatory Visit (INDEPENDENT_AMBULATORY_CARE_PROVIDER_SITE_OTHER): Payer: Medicare Other | Admitting: "Endocrinology

## 2019-01-28 ENCOUNTER — Other Ambulatory Visit: Payer: Self-pay

## 2019-01-28 DIAGNOSIS — E890 Postprocedural hypothyroidism: Secondary | ICD-10-CM | POA: Diagnosis not present

## 2019-01-28 DIAGNOSIS — C73 Malignant neoplasm of thyroid gland: Secondary | ICD-10-CM | POA: Insufficient documentation

## 2019-01-28 MED ORDER — LEVOTHYROXINE SODIUM 125 MCG PO TABS: 125.0000 ug | ORAL_TABLET | Freq: Every day | ORAL | 6 refills | Status: DC

## 2019-01-28 NOTE — Progress Notes (Signed)
01/28/2019, 4:32 PM                                Endocrinology Telehealth Visit Follow up Note -During COVID -19 Pandemic  I connected with Lacey Ramsey on 01/28/2019   by telephone and verified that I am speaking with the correct person using two identifiers. Lacey Ramsey, Sep 24, 1950. she has verbally consented to this visit. All issues noted in this document were discussed and addressed. The format was not optimal for physical exam.   Subjective:    Patient ID: Lacey Ramsey, female    DOB: 01-23-1951, PCP Lacey Crigler, NP   Past Medical History:  Diagnosis Date  . Anxiety   . Cancer ([place])    Thyroid   . GERD (gastroesophageal reflux disease)   . Hypothyroidism   . IBS (irritable bowel syndrome)   . Insomnia   . Thyroid mass    right   Past Surgical History:  Procedure Laterality Date  . APPENDECTOMY    . CHOLECYSTECTOMY N/A 05/13/2017   Procedure: LAPAROSCOPIC CHOLECYSTECTOMY;  Surgeon: Olean Ree, MD;  Location: [facility] ORS;  Service: General;  Laterality: N/A;  . ERCP N/A 11/17/2017   Procedure: ENDOSCOPIC RETROGRADE CHOLANGIOPANCREATOGRAPHY (ERCP);  Surgeon: Lucilla Lame, MD;  Location: [facility] ENDOSCOPY;  Service: Endoscopy;  Laterality: N/A;  . HERNIA REPAIR    . KNEE ARTHROSCOPY    . THYROIDECTOMY Right 11/24/2018   Procedure: RIGHT HEMI THYROIDECTOMY;  Surgeon: Leta Baptist, MD;  Location: [place];  Service: ENT;  Laterality: Right;  . THYROIDECTOMY Left 11/27/2018  . THYROIDECTOMY N/A 11/27/2018   Procedure: COMPLETION THYROIDECTOMY;  Surgeon: Leta Baptist, MD;  Location: [facility] OR;  Service: ENT;  Laterality: N/A;  . TONSILLECTOMY    . TUBAL LIGATION     Social History   Socioeconomic History  . Marital status: Married    Spouse name: Not on file  . Number of children: Not on file  . Years of education: Not on file  . Highest education level: Not on file  Occupational History  . Not on file   Social Needs  . Financial resource strain: Not on file  . Food insecurity    Worry: Not on file    Inability: Not on file  . Transportation needs    Medical: Not on file    Non-medical: Not on file  Tobacco Use  . Smoking status: Never Smoker  . Smokeless tobacco: Never Used  Substance and Sexual Activity  . Alcohol use: Yes    Comment: social  . Drug use: No  . Sexual activity: Yes    Birth control/protection: Post-menopausal  Lifestyle  . Physical activity    Days per week: Not on file    Minutes per session: Not on file  . Stress: Not on file  Relationships  . Social Herbalist on phone: Not on file    Gets together: Not on file    Attends religious service: Not on file    Active member of club or organization: Not on file    Attends meetings of clubs or organizations: Not on file  Relationship status: Not on file  Other Topics Concern  . Not on file  Social History Narrative  . Not on file   Family History  Problem Relation Age of Onset  . Cancer Mother   . Breast cancer Sister   . Aneurysm Father   . Heart attack Paternal Grandfather   . Heart attack Paternal Grandmother   . Cancer Maternal Grandfather        skin  . Suicidality Daughter   . Alcohol abuse Daughter    Outpatient Encounter Medications as of 01/28/2019  Medication Sig  . amitriptyline (ELAVIL) 25 MG tablet Take 25 mg by mouth daily.   . calcium-vitamin D (OSCAL WITH D) 500-200 MG-UNIT tablet Take 1 tablet by mouth 2 (two) times daily.  . cholestyramine (QUESTRAN) 4 g packet Take 1 packet (4 g total) by mouth 2 (two) times daily. (Patient taking differently: Take 4 g by mouth 2 (two) times daily as needed (for stomach). )  . FLUoxetine (PROZAC) 10 MG tablet Take 10 mg by mouth at bedtime.   Marland Kitchen levothyroxine (SYNTHROID) 125 MCG tablet Take 1 tablet (125 mcg total) by mouth daily before breakfast.  . loperamide (IMODIUM) 2 MG capsule Take 1 capsule (2 mg total) by mouth as needed for  diarrhea or loose stools. (Patient taking differently: Take 1 mg by mouth as needed for diarrhea or loose stools. )  . pantoprazole (PROTONIX) 40 MG tablet Take 1 tablet (40 mg total) by mouth 2 (two) times daily before a meal. (Patient taking differently: Take 40 mg by mouth daily as needed (for acid reflux). )  . Probiotic Product (PROBIOTIC-10 PO) Take by mouth daily.  . traZODone (DESYREL) 100 MG tablet Take 100 mg by mouth at bedtime.   . vitamin B-12 (CYANOCOBALAMIN) 500 MCG tablet Take 500 mcg by mouth daily.  . [DISCONTINUED] levothyroxine (EUTHYROX) 100 MCG tablet Take 100 mcg by mouth daily before breakfast.   No facility-administered encounter medications on file as of 01/28/2019.    ALLERGIES: Allergies  Allergen Reactions  . Codeine Nausea And Vomiting  . Other Other (See Comments)    Surgical glue-itchy rash    VACCINATION STATUS: Immunization History  Administered Date(s) Administered  . Influenza, High Dose Seasonal PF 03/24/2017  . Tdap 12/08/2017    HPI Lacey Ramsey is 68 y.o. 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 After appropriate work-up with fine-needle aspiration of nodular lesion in her thyroid, she underwent right thyroid lobectomy on November 24, 2018 which revealed 2.1 cm papillary thyroid cancer. 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 She has recovered very well from her surgery.  -After her last visit with me, she was offered adjuvant therapy with I-131 thyroid remnant ablation using Thyrogen which she has completed on January 25, 2019.  Findings with physiologic uptake in the submandibular glands.  No distant metastatic disease identified.  No residual thyroid tissue seen in the neck.    She is currently on thyroid hormone supplement with Euthyrox  100 mcg p.o. nightly.  She is compliant.  She denies dysphagia, shortness of breath, voice change. She has family history of various thyroid dysfunction, including hypothyroidism in her sister, no family history of thyroid malignancy. She is also on calcium supplements.  Review of Systems  Limited as above. Objective:    There were  no vitals taken for this visit.  Wt Readings from Last 3 Encounters:  12/30/18 127 lb (57.6 kg)  11/27/18 128 lb 9.6 oz (58.3 kg)  11/24/18 127 lb 13.9 oz (58 kg)      CMP ( most recent) CMP     Component Value Date/Time   NA 140 11/27/2018 0716   NA 142 03/11/2018 1220   K 4.3 11/27/2018 0716   CL 106 11/27/2018 0716   CO2 22 11/27/2018 0716   GLUCOSE 75 11/27/2018 0716   BUN 15 11/27/2018 0716   BUN 7 (L) 03/11/2018 1220   CREATININE 0.85 11/27/2018 0716   CREATININE 0.89 04/27/2018 1509   CALCIUM 8.7 (L) 11/28/2018 1106   PROT 6.3 04/27/2018 1509   PROT 6.1 03/11/2018 1220   ALBUMIN 4.1 03/11/2018 1220   AST 22 04/27/2018 1509   ALT 14 04/27/2018 1509   ALKPHOS 95 03/11/2018 1220   BILITOT 0.5 04/27/2018 1509   BILITOT 0.4 03/11/2018 1220   GFRNONAA >60 11/27/2018 0716   GFRAA >60 11/27/2018 0716    November 24, 2018 right thyroid lobectomy: 2.1 cm papillary thyroid cancer pT2PNX November 27, 2018 left thyroid  Lobectomy: 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 The SARS-CoV-2 RNA is generally detectable in upper and lower respiratory specimens during the acute phase of infection. Negative results do not preclude SARS-CoV-2 infection, do not rule out co-infections with other pathogens, and should not be  used as the sole basis for treatment or other patient management decisions. Negative results must be combined with clinical observations, patient history, and epidemiological information. The expected result is Negative. Fact Sheet for Patients: SugarRoll.be Fact Sheet for Healthcare Providers: https://www.woods-mathews.com/ This test is not yet approved or cleared by the [place] FDA and  has been authorized for detection and/or diagnosis of SARS-CoV-2 by FDA under an Emergency Use Authorization (EUA). This EUA will remain  in effect (meaning this test can be used) for the duration of the COVID-19 declaration under Section 56 4(b)(1) of the Act, 21 U.S.C. section 360bbb-3(b)(1), unless the authorization is terminated or revoked sooner. Performed at [facility], [place] [address] [place], [place] [address]   SARS Coronavirus 2 (CEPHEID - Performed in [place] hospital lab), Hosp Order     Status: None   Collection Time: 11/27/18  6:44 AM   Specimen: Nasopharyngeal Swab  Result Value Ref Range   SARS Coronavirus 2 NEGATIVE NEGATIVE    Comment: (NOTE) If result is NEGATIVE SARS-CoV-2 target nucleic acids are NOT DETECTED. The SARS-CoV-2 RNA is generally detectable in upper and lower  respiratory specimens during the acute phase of infection. The lowest  concentration of SARS-CoV-2 viral copies this assay can detect is 250  copies / mL. A negative result does not preclude SARS-CoV-2 infection  and should not be used as the sole basis for treatment or other  patient management decisions.  A negative result may occur with  improper specimen collection / handling, submission of specimen other  than nasopharyngeal swab, presence of viral mutation(s) within the  areas targeted by this assay, and inadequate number of viral copies  (<  250 copies / mL). A negative result must be combined with clinical  observations, patient history, and  epidemiological information. If result is POSITIVE SARS-CoV-2 target nucleic acids are DETECTED. The SARS-CoV-2 RNA is generally detectable in upper and lower  respiratory specimens dur ing the acute phase of infection.  Positive  results are indicative of active infection with SARS-CoV-2.  Clinical  correlation with patient history and other diagnostic information is  necessary to determine patient infection status.  Positive results do  not rule out bacterial infection or co-infection with other viruses. If result is PRESUMPTIVE POSTIVE SARS-CoV-2 nucleic acids MAY BE PRESENT.   A presumptive positive result was obtained on the submitted specimen  and confirmed on repeat testing.  While 2019 novel coronavirus  (SARS-CoV-2) nucleic acids may be present in the submitted sample  additional confirmatory testing may be necessary for epidemiological  and / or clinical management purposes  to differentiate between  SARS-CoV-2 and other Sarbecovirus currently known to infect humans.  If clinically indicated additional testing with an alternate test  methodology [phone]) is advised. The SARS-CoV-2 RNA is generally  detectable in upper and lower respiratory sp ecimens during the acute  phase of infection. The expected result is Negative. Fact Sheet for Patients:  StrictlyIdeas.no Fact Sheet for Healthcare Providers: BankingDealers.co.za This test is not yet approved or cleared by the [place] FDA and has been authorized for detection and/or diagnosis of SARS-CoV-2 by FDA under an Emergency Use Authorization (EUA).  This EUA will remain in effect (meaning this test can be used) for the duration of the COVID-19 declaration under Section 564(b)(1) of the Act, 21 U.S.C. section 360bbb-3(b)(1), unless the authorization is terminated or revoked sooner. Performed at [facility], [place] [address], [place], [place] [address]   CBC      Status: None   Collection Time: 11/27/18  7:16 AM  Result Value Ref Range   WBC 4.8 4.0 - 10.5 K/uL   RBC 4.21 3.87 - 5.11 MIL/uL   Hemoglobin 12.6 12.0 - 15.0 g/dL   HCT 39.8 36.0 - 46.0 %   MCV 94.5 80.0 - 100.0 fL   MCH 29.9 26.0 - 34.0 pg   MCHC 31.7 30.0 - 36.0 g/dL   RDW 12.3 11.5 - 15.5 %   Platelets 177 150 - 400 K/uL   nRBC 0.0 0.0 - 0.2 %    Comment: Performed at [facility], [place] [address], [place], [place] [id number]  Basic metabolic panel     Status: None   Collection Time: 11/27/18  7:16 AM  Result Value Ref Range   Sodium 140 135 - 145 mmol/L   Potassium 4.3 3.5 - 5.1 mmol/L   Chloride 106 98 - 111 mmol/L   CO2 22 22 - 32 mmol/L   Glucose, Bld 75 70 - 99 mg/dL   BUN 15 8 - 23 mg/dL   Creatinine, Ser 0.85 0.44 - 1.00 mg/dL   Calcium 9.2 8.9 - 10.3 mg/dL   GFR calc non Af Amer >60 >60 mL/min   GFR calc Af Amer >60 >60 mL/min   Anion gap 12 5 - 15    Comment: Performed at [facility], [place] [address] [place], [place] [address]  Calcium     Status: Abnormal   Collection Time: 11/27/18 12:27 PM  Result Value Ref Range   Calcium 8.8 (L) 8.9 - 10.3 mg/dL    Comment: Performed at [facility], [place]  [address] [place], [place] [address]  Calcium     Status: Abnormal   Collection Time: 11/27/18  9:08 PM  Result Value Ref Range   Calcium 8.5 (L) 8.9 - 10.3 mg/dL    Comment: Performed at [facility] [address], [place], [place] [address]  Calcium     Status: Abnormal   Collection Time: 11/28/18  4:48 AM  Result Value Ref Range   Calcium 8.5 (L) 8.9 - 10.3 mg/dL    Comment: Performed at [facility] [address], [place], [place] [address]  Calcium     Status: Abnormal   Collection Time: 11/28/18 11:06 AM  Result Value Ref Range   Calcium 8.7 (L) 8.9 - 10.3 mg/dL    Comment: Performed at [facility] [address], [place], [place] [address]  Thyroglobulin Level     Status: Abnormal   Collection Time: 01/21/19  1:28  PM  Result Value Ref Range   Thyroglobulin <0.1 (L) ng/mL    Comment:       Reference Range:       Intact Thyroid   2.8-40.9       Athyrotic        <0.1 .       Note: Abnormal flagging is based       on the reference interval for        patients with intact thyroid. . . This test was performed using the Beckman Coulter  chemiluminescent method. Values obtained from different assay methods cannot be used interchangeably. Thyroglobulin levels, regardless of value, should not be interpreted as absolute evidence of the presence or absence of disease. .    Comment      Comment: . Thyroglobulin antibodies (TGAB) interfere with thyroglobulin (TG) assays; therefore, TGAB assay should always be performed in conjunction with a TG assay. . . For additional information, please refer to  http://education.questdiagnostics.com/faq/FAQ202  (This link is being provided for informational/ educational purposes only.) .   Thyroglobulin antibody     Status: Abnormal   Collection Time: 01/21/19  1:28 PM  Result Value Ref Range   Thyroglobulin Ab 7 (H) < or = 1 IU/mL  T4, free     Status: None   Collection Time: 01/21/19  1:28 PM  Result Value Ref Range   Free T4 1.3 0.8 - 1.8 ng/dL  TSH     Status: Abnormal   Collection Time: 01/21/19  1:28 PM  Result Value Ref Range   TSH 5.48 (H) 0.40 - 4.50 mIU/L   Thyrogen stimulated I-131 thyroid remnant ablation with post therapy whole-body scan completed on January 25, 2019  FINDINGS: There is physiologic uptake on today's study including in the submandibular glands. No distant metastatic disease identified. No residual thyroid seen in the neck. IMPRESSION: No distant metastatic disease. No residual thyroid tissue seen in the neck.   Assessment & Plan:   1. Malignant neoplasm of thyroid gland ([place]) 2. Postsurgical hypothyroidism   A) right thyroid lobectomy - on November 24, 2018 which revealed 2.1 cm papillary thyroid cancer. 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  C) I-131 thyroid remnant ablation on January 25, 2019  This completes her initial intensive treatment for follicular variant papillary thyroid cancer, with some lymphovascular invasion on the left. She will be considered for yearly imaging studies after her next visit.   Regarding her postsurgical hypothyroidism: -She would benefit from  a higher dose of levothyroxine for replacement with a suppression purposes.  I discussed and increased levothyroxine to 125 mcg p.o. daily before breakfast.   - We discussed about the correct intake of her thyroid hormone, on empty stomach at fasting, with water, separated by at least 30 minutes from breakfast and other medications,  and separated by more than 4 hours from calcium, iron, multivitamins, acid reflux medications (PPIs). -Patient is made aware of the fact that thyroid hormone replacement is needed for life, dose to be adjusted by periodic monitoring of thyroid function tests.  She is currently on calcium supplements, oscal, 500/200 p.o. twice daily-we will continue until labs with CMP. - I advised her  to maintain close follow up with [place] Georgeanna Lea, NP for primary care needs.  - Patient Care Time Today:  25 min, of which >50% was spent in  counseling and the rest reviewing her  current and  previous labs/studies, imagings, whole-body scan, previous treatments,  and medications' doses and developing a plan for long-term care based on the latest recommendations for standards of care.   Crystalrose Washer participated in the discussions, expressed understanding, and voiced agreement with the above plans.  All questions were answered to her satisfaction. she is encouraged to contact clinic should she have any questions or concerns prior to her return visit.  Follow up plan: Return in about 6 months (around 07/28/2019) for Follow up with Pre-visit  Labs.   Glade Lloyd, MD [facility] [address] [place], [place] [address] Phone: [phone]  Fax: [phone]     01/28/2019, 4:32 PM  This note was partially dictated with voice recognition software. Similar sounding words can be transcribed inadequately or may not  be corrected upon review.

## 2019-03-05 DIAGNOSIS — G4700 Insomnia, unspecified: Secondary | ICD-10-CM | POA: Diagnosis not present

## 2019-03-05 DIAGNOSIS — K047 Periapical abscess without sinus: Secondary | ICD-10-CM | POA: Diagnosis not present

## 2019-03-24 ENCOUNTER — Other Ambulatory Visit (INDEPENDENT_AMBULATORY_CARE_PROVIDER_SITE_OTHER): Payer: Self-pay | Admitting: Otolaryngology

## 2019-03-24 DIAGNOSIS — C73 Malignant neoplasm of thyroid gland: Secondary | ICD-10-CM | POA: Diagnosis not present

## 2019-03-31 ENCOUNTER — Other Ambulatory Visit (INDEPENDENT_AMBULATORY_CARE_PROVIDER_SITE_OTHER): Payer: Self-pay | Admitting: Otolaryngology

## 2019-03-31 DIAGNOSIS — C73 Malignant neoplasm of thyroid gland: Secondary | ICD-10-CM | POA: Diagnosis not present

## 2019-04-02 LAB — CALCIUM
Calcium: 9.4 mg/dL (ref 8.7–10.3)
Calcium: 8.8 mg/dL (ref 8.7–10.3)

## 2019-06-07 ENCOUNTER — Ambulatory Visit (INDEPENDENT_AMBULATORY_CARE_PROVIDER_SITE_OTHER): Payer: Medicare Other | Admitting: Gastroenterology

## 2019-06-25 DIAGNOSIS — R195 Other fecal abnormalities: Secondary | ICD-10-CM | POA: Diagnosis not present

## 2019-07-01 ENCOUNTER — Other Ambulatory Visit: Payer: Self-pay

## 2019-07-01 ENCOUNTER — Ambulatory Visit (INDEPENDENT_AMBULATORY_CARE_PROVIDER_SITE_OTHER): Payer: Medicare Other | Admitting: Gastroenterology

## 2019-07-01 ENCOUNTER — Encounter (INDEPENDENT_AMBULATORY_CARE_PROVIDER_SITE_OTHER): Payer: Self-pay | Admitting: Gastroenterology

## 2019-07-01 VITALS — BP 115/80 | HR 70 | Temp 98.1°F | Ht 62.0 in | Wt 124.3 lb

## 2019-07-01 DIAGNOSIS — R195 Other fecal abnormalities: Secondary | ICD-10-CM | POA: Diagnosis not present

## 2019-07-01 DIAGNOSIS — R197 Diarrhea, unspecified: Secondary | ICD-10-CM

## 2019-07-01 NOTE — Patient Instructions (Signed)
We are checking stool studies for evidence of infection.  You can continue using dicyclomine and Imodium for episodes.  Try and avoid a lot of caffeine or other food triggers.  We will schedule colonoscopy when able with Covid restrictions [place] please call us in interim with any questions.

## 2019-07-01 NOTE — Progress Notes (Signed)
Patient profile: Lacey Ramsey is a 69 y.o. female seen for evaluation of abnormal stool referred by Lamarr Lulas, NP  History of Present Illness: Lacey Ramsey is seen today for evaluation of abnormal stool.  She has noticed over the past month both a combination of some rubber appearing areas in her stool as well as some particles coming out of the stool.  She notes initially thought this was due to iceberg lettuce but has continued.  Her stool consistency is chronically loose and she has had her gallbladder out.  She typically goes 1-2 times a day (looser consistency) but frequently has "bouts of diarrhea" where she can have abdominal cramping, several stools in a day and become clammy/sweaty, etc.  She also has abdominal pain with the bouts.  She uses Imodium or Questran during these episodes.  She has not noted any blood in stools.  She chronically is on Protonix twice a day and denies any GERD symptoms. No dysphagia. Feels her appetite is good.  Very occasional nausea but no vomiting. She does endorse some bloating and foul-smelling gas.  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  She does take a probiotic daily.  She uses dicyclomine twice a day routinely and Imodium and Questran as needed.  Generally avoids sodas and dairy as these make symptoms worse.  Wt Readings from Last 3 Encounters:  07/01/19 124 lb 4.8 oz (56.4 kg)  12/30/18 127 lb (57.6 kg)  11/27/18 128 lb 9.6 oz (58.3 kg)     Last Colonoscopy: about 5 years ago, [place], polyps, recommended 5 year repeat  Last Endoscopy: 06/2017-unable to view report in care everywhere, done at [facility], followed up by repair of para esophageal hernia in March 2019  ERCP June 2019--Done for bile duct dilation on CT.  Single mild biliary stricture benign-appearing found, entire main bile duct dilated, biliary sphincterotomy was performed   Past Medical History:  Past  Medical History:  Diagnosis Date  . Anxiety   . Cancer ([place])    Thyroid   . GERD (gastroesophageal reflux disease)   . Hypothyroidism   . IBS (irritable bowel syndrome)   . Insomnia   . Thyroid mass    right    Problem List: Patient Active Problem List   Diagnosis Date Noted  . 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 S/P complete thyroidectomy 11/27/2018  . S/P partial thyroidectomy 11/24/2018  . Common bile duct (CBD) obstruction   . Other specified diseases of biliary tract   . Abdominal pain 11/15/2017  . 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 Gallbladder mass   . Anxiety, generalized 03/24/2017  . Hypothyroidism, unspecified 03/24/2017  . Irritable bowel syndrome with diarrhea 03/24/2017  . Other insomnia 03/24/2017  . 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 Gastroesophageal reflux disease without esophagitis 03/20/2017  . Hyperlipidemia, mixed 03/20/2017    Past Surgical History: Past Surgical History:  Procedure Laterality Date  . APPENDECTOMY    . CHOLECYSTECTOMY N/A 05/13/2017   Procedure: LAPAROSCOPIC CHOLECYSTECTOMY;  Surgeon: Olean Ree, MD;  Location: [facility] ORS;  Service: General;  Laterality: N/A;  . ERCP N/A 11/17/2017   Procedure: ENDOSCOPIC RETROGRADE CHOLANGIOPANCREATOGRAPHY (ERCP);  Surgeon: Lucilla Lame, MD;  Location: [facility] ENDOSCOPY;  Service: Endoscopy;  Laterality: N/A;  . HERNIA REPAIR    . KNEE ARTHROSCOPY    . THYROIDECTOMY Right 11/24/2018   Procedure: RIGHT HEMI THYROIDECTOMY;  Surgeon: Benjamine Mola,  Su, MD;  Location: [facility];  Service: ENT;  Laterality: Right;  . THYROIDECTOMY Left 11/27/2018  . THYROIDECTOMY N/A 11/27/2018   Procedure: COMPLETION THYROIDECTOMY;  Surgeon: Leta Baptist, MD;  Location: [facility] OR;  Service: ENT;  Laterality: N/A;  . TONSILLECTOMY    . TUBAL LIGATION      Allergies: Allergies  Allergen Reactions  . Codeine Nausea And Vomiting  . Other Other (See Comments)      Surgical glue-itchy rash      Home Medications:  Current Outpatient Medications:  .  cholestyramine (QUESTRAN) 4 g packet, Take 1 packet (4 g total) by mouth 2 (two) times daily. (Patient taking differently: Take 4 g by mouth 2 (two) times daily as needed (for stomach). ), Disp: 60 packet, Rfl: 6 .  dicyclomine (BENTYL) 10 MG capsule, Take 10 mg by mouth 2 (two) times daily before a meal. , Disp: , Rfl:  .  FLUoxetine (PROZAC) 10 MG tablet, Take 10 mg by mouth at bedtime. , Disp: , Rfl: 3 .  levothyroxine (SYNTHROID) 125 MCG tablet, Take 1 tablet (125 mcg total) by mouth daily before breakfast., Disp: 30 tablet, Rfl: 6 .  loperamide (IMODIUM) 2 MG capsule, Take 1 capsule (2 mg total) by mouth as needed for diarrhea or loose stools. (Patient taking differently: Take 1 mg by mouth as needed for diarrhea or loose stools. ), Disp: 30 capsule, Rfl: 0 .  pantoprazole (PROTONIX) 40 MG tablet, Take 1 tablet (40 mg total) by mouth 2 (two) times daily before a meal. (Patient taking differently: Take 40 mg by mouth daily as needed (for acid reflux). ), Disp: 60 tablet, Rfl: 6 .  Probiotic Product (PROBIOTIC-10 PO), Take by mouth daily., Disp: , Rfl:  .  traZODone (DESYREL) 100 MG tablet, Take 100 mg by mouth at bedtime. , Disp: , Rfl:  .  vitamin B-12 (CYANOCOBALAMIN) 500 MCG tablet, Take 500 mcg by mouth daily., Disp: , Rfl:  .  calcium-vitamin D (OSCAL WITH D) 500-200 MG-UNIT tablet, Take 1 tablet by mouth 2 (two) times daily. (Patient not taking: Reported on 07/01/2019), Disp: 60 tablet, Rfl: 3   Family History: family history includes Alcohol abuse in her daughter; Aneurysm in her father; Breast cancer in her sister; Cancer in her maternal grandfather and mother; Heart attack in her paternal grandfather and paternal grandmother; Suicidality in her daughter.  no family history colon polyps or colon cancer    Social History:   reports that she has never smoked. She has never used smokeless tobacco.  She reports current alcohol use. She reports that she does not use drugs.     Review of Systems: Constitutional: Denies weight loss/weight gain  Eyes: No changes in vision. ENT: No oral lesions, sore throat.  GI: see HPI.  Heme/Lymph: No easy bruising.  CV: No chest pain.  GU: No hematuria.  Integumentary: No rashes.  Neuro: No headaches.  Psych: No depression/anxiety.  Endocrine: No heat/cold intolerance.  Allergic/Immunologic: No urticaria.  Resp: No cough, SOB.  Musculoskeletal: No joint swelling.    Physical Examination: BP 115/80 (BP Location: Right Arm, Patient Position: Sitting, Cuff Size: Large)   Pulse 70   Temp 98.1 F (36.7 C) (Temporal)   Ht 5\' 2"  (1.575 m)   Wt 124 lb 4.8 oz (56.4 kg)   BMI 22.73 kg/m  Gen: NAD, alert and oriented x 4 HEENT: PEERLA, EOMI, Neck: supple, no JVD Chest: CTA bilaterally, no wheezes, crackles, or other adventitious sounds CV:  RRR, no m/g/c/r Abd: soft, NT, ND, +BS in all four quadrants; no HSM, guarding, ridigity, or rebound tenderness Ext: no edema, well perfused with 2+ pulses, Skin: no rash or lesions noted on observed skin Lymph: no noted LAD  Data: January 2021-negative O&P x 2. Requesting most recent blood work labs from PCP  DG esphgram: 06/11/2017 IMPRESSION: Large hiatal hernia with much of the stomach lying within the thoracic cavity. The GE junction lies along the inferior aspect of the intrathoracic portion of the stomach. Emptying of the hernia into the intraabdominal portion of the stomach did not appear restricted. Gastric emptying into the duodenum was prompt.    Assessment/Plan: Ms. Abaya is a 69 y.o. female  Lesa was seen today for follow-up.  Diagnoses and all orders for this visit:  Abnormal feces -     H. pylori antigen, stool -     Gastrointestinal Pathogen Panel PCR -     C. difficile GDH and Toxin A/B  Diarrhea, unspecified type -     H. pylori antigen, stool -     Gastrointestinal  Pathogen Panel PCR -     C. difficile GDH and Toxin A/B     1.  Diarrhea-has had chronic irritable bowel-like symptoms, recently has seen "rubbery areas" in her stools.  PCP checked and O&P was negative.  Also having more frequent bouts of severe diarrhea. Given she also feels her water smells bad will check full infectious panel for diarrhea. She is also concerned about Hpylori.  May be flare of irritable bowel type symptoms.  She is on dicyclomine twice daily chronically and has Imodium to use as needed.  She also uses cholestyramine long-term.  During episodes.  Can biopsy for microscopic colitis at time of colonoscopy if symptoms still occurring  2.  History of colon polyps-reports last colonoscopy over 5 years ago in [place] and was told to have 5-year repeat.  Has family history of colon polyps colon cancer   3. GERD-she is asymptomatic on PPI twice daily.  She has had antireflux surgery, diet modifications reviewed.  Given no current upper GI symptoms we will hold off on endoscopy at time of colonoscopy  Patient denies CP, SOB, and use of blood thinners. I discussed the risks and benefits of procedure including bleeding, perforation, infection, missed lesions, medication reactions and possible hospitalization or surgery if complications. All questions answered. No prior issues w/ sedation  Order for colonoscopy to be placed in chart when able to schedule with Covid restrictions   I personally performed the service, non-incident to. (WP)  Laurine Blazer, [facility]

## 2019-07-02 DIAGNOSIS — R197 Diarrhea, unspecified: Secondary | ICD-10-CM | POA: Diagnosis not present

## 2019-07-02 DIAGNOSIS — R195 Other fecal abnormalities: Secondary | ICD-10-CM | POA: Diagnosis not present

## 2019-07-07 ENCOUNTER — Telehealth (INDEPENDENT_AMBULATORY_CARE_PROVIDER_SITE_OTHER): Payer: Self-pay | Admitting: Gastroenterology

## 2019-07-07 LAB — GASTROINTESTINAL PATHOGEN PANEL PCR
C. difficile Tox A/B, PCR: NOT DETECTED
Campylobacter, PCR: NOT DETECTED
Cryptosporidium, PCR: NOT DETECTED
E coli (ETEC) LT/ST PCR: NOT DETECTED
E coli (STEC) stx1/stx2, PCR: NOT DETECTED
E coli 0157, PCR: NOT DETECTED
Giardia lamblia, PCR: NOT DETECTED
Norovirus, PCR: NOT DETECTED
Rotavirus A, PCR: NOT DETECTED
Salmonella, PCR: NOT DETECTED
Shigella, PCR: NOT DETECTED

## 2019-07-07 LAB — C. DIFFICILE GDH AND TOXIN A/B
GDH ANTIGEN: NOT DETECTED
MICRO NUMBER:: 10122944
SPECIMEN QUALITY:: ADEQUATE
TOXIN A AND B: NOT DETECTED

## 2019-07-07 LAB — HELICOBACTER PYLORI  SPECIAL ANTIGEN
MICRO NUMBER:: 10122943
SPECIMEN QUALITY: ADEQUATE

## 2019-07-07 NOTE — Telephone Encounter (Signed)
Patient called regarding test results - please advise - [phone]

## 2019-07-07 NOTE — Telephone Encounter (Signed)
C diff and h pylori stool was negative - I called pt w/ results. All questions answered.  She reports she has not had an abnormal appearing stool this week but if she does instructed to take photo on phone and bring to colonoscopy.

## 2019-07-08 ENCOUNTER — Other Ambulatory Visit (INDEPENDENT_AMBULATORY_CARE_PROVIDER_SITE_OTHER): Payer: Self-pay | Admitting: *Deleted

## 2019-07-08 ENCOUNTER — Telehealth (INDEPENDENT_AMBULATORY_CARE_PROVIDER_SITE_OTHER): Payer: Self-pay | Admitting: *Deleted

## 2019-07-08 ENCOUNTER — Encounter (INDEPENDENT_AMBULATORY_CARE_PROVIDER_SITE_OTHER): Payer: Self-pay | Admitting: *Deleted

## 2019-07-08 DIAGNOSIS — R197 Diarrhea, unspecified: Secondary | ICD-10-CM

## 2019-07-08 DIAGNOSIS — Z86010 Personal history of colonic polyps: Secondary | ICD-10-CM

## 2019-07-08 MED ORDER — PLENVU 140 G PO SOLR: 1.0000 | Freq: Once | ORAL | 0 refills | Status: AC

## 2019-07-08 NOTE — Telephone Encounter (Signed)
Patient needs plenvu (copay card) TCS sch'd 3/24

## 2019-07-14 ENCOUNTER — Telehealth (INDEPENDENT_AMBULATORY_CARE_PROVIDER_SITE_OTHER): Payer: Self-pay | Admitting: Gastroenterology

## 2019-07-14 NOTE — Telephone Encounter (Signed)
Unable to get the patient on the phone and unable to do so. I did call her husband's number and left a message on his voicemail with Tor Netters instruction.

## 2019-07-14 NOTE — Telephone Encounter (Signed)
Please notify patient that her last stool study was negative for infection. Thanks.   Continue w/ colonoscopy as planned and if has abnormal looking stools in interim take photo and bring to colonoscopy per Dr Laural Golden

## 2019-07-14 NOTE — Telephone Encounter (Signed)
Patient called regarding 2nd test result - ph# [phone]

## 2019-07-29 ENCOUNTER — Other Ambulatory Visit: Payer: Self-pay

## 2019-07-29 ENCOUNTER — Telehealth: Payer: Self-pay | Admitting: "Endocrinology

## 2019-07-29 DIAGNOSIS — E890 Postprocedural hypothyroidism: Secondary | ICD-10-CM

## 2019-07-29 NOTE — Telephone Encounter (Signed)
Can you update lab order °

## 2019-07-29 NOTE — Telephone Encounter (Signed)
Lab orders updated and sent. 

## 2019-07-30 ENCOUNTER — Ambulatory Visit: Payer: Medicare Other | Admitting: "Endocrinology

## 2019-07-30 DIAGNOSIS — E890 Postprocedural hypothyroidism: Secondary | ICD-10-CM | POA: Diagnosis not present

## 2019-07-31 LAB — COMPREHENSIVE METABOLIC PANEL
AG Ratio: 1.8 (calc) (ref 1.0–2.5)
ALT: 9 U/L (ref 6–29)
AST: 16 U/L (ref 10–35)
Albumin: 3.7 g/dL (ref 3.6–5.1)
Alkaline phosphatase (APISO): 55 U/L (ref 37–153)
BUN: 16 mg/dL (ref 7–25)
CO2: 29 mmol/L (ref 20–32)
Calcium: 9.2 mg/dL (ref 8.6–10.4)
Chloride: 107 mmol/L (ref 98–110)
Creat: 0.84 mg/dL (ref 0.60–0.93)
Globulin: 2.1 g/dL (calc) (ref 1.9–3.7)
Glucose, Bld: 91 mg/dL (ref 65–139)
Potassium: 3.8 mmol/L (ref 3.5–5.3)
Sodium: 142 mmol/L (ref 135–146)
Total Bilirubin: 0.4 mg/dL (ref 0.2–1.2)
Total Protein: 5.8 g/dL — ABNORMAL LOW (ref 6.1–8.1)

## 2019-07-31 LAB — TSH: TSH: 0.01 mIU/L — ABNORMAL LOW (ref 0.40–4.50)

## 2019-07-31 LAB — T4, FREE: Free T4: 2 ng/dL — ABNORMAL HIGH (ref 0.8–1.8)

## 2019-08-04 ENCOUNTER — Ambulatory Visit (INDEPENDENT_AMBULATORY_CARE_PROVIDER_SITE_OTHER): Payer: Medicare Other | Admitting: "Endocrinology

## 2019-08-04 ENCOUNTER — Encounter: Payer: Self-pay | Admitting: "Endocrinology

## 2019-08-04 DIAGNOSIS — E890 Postprocedural hypothyroidism: Secondary | ICD-10-CM | POA: Diagnosis not present

## 2019-08-04 DIAGNOSIS — C73 Malignant neoplasm of thyroid gland: Secondary | ICD-10-CM

## 2019-08-04 MED ORDER — LEVOTHYROXINE SODIUM 112 MCG PO TABS: 112.0000 ug | ORAL_TABLET | Freq: Every day | ORAL | 6 refills | Status: DC

## 2019-08-04 NOTE — Progress Notes (Signed)
08/04/2019, 5:46 PM                                    Endocrinology Telehealth Visit Follow up Note -During COVID -19 Pandemic  I connected with Lacey Ramsey on 08/04/2019   by telephone and verified that I am speaking with the correct person using two identifiers. Lacey Ramsey, July 08, 1950. she has verbally consented to this visit. All issues noted in this document were discussed and addressed. The format was not optimal for physical exam.   Subjective:    Patient ID: Lacey Ramsey, female    DOB: 21-Jun-1950, PCP Renne Crigler, NP   Past Medical History:  Diagnosis Date  . Anxiety   . Cancer ([place])    Thyroid   . GERD (gastroesophageal reflux disease)   . Hypothyroidism   . IBS (irritable bowel syndrome)   . Insomnia   . Thyroid mass    right   Past Surgical History:  Procedure Laterality Date  . APPENDECTOMY    . CHOLECYSTECTOMY N/A 05/13/2017   Procedure: LAPAROSCOPIC CHOLECYSTECTOMY;  Surgeon: Olean Ree, MD;  Location: [facility] ORS;  Service: General;  Laterality: N/A;  . ERCP N/A 11/17/2017   Procedure: ENDOSCOPIC RETROGRADE CHOLANGIOPANCREATOGRAPHY (ERCP);  Surgeon: Lucilla Lame, MD;  Location: [facility];  Service: Endoscopy;  Laterality: N/A;  . HERNIA REPAIR    . KNEE ARTHROSCOPY    . THYROIDECTOMY Right 11/24/2018   Procedure: RIGHT HEMI THYROIDECTOMY;  Surgeon: Leta Baptist, MD;  Location: [facility];  Service: ENT;  Laterality: Right;  . THYROIDECTOMY Left 11/27/2018  . THYROIDECTOMY N/A 11/27/2018   Procedure: COMPLETION THYROIDECTOMY;  Surgeon: Leta Baptist, MD;  Location: [facility] OR;  Service: ENT;  Laterality: N/A;  . TONSILLECTOMY    . TUBAL LIGATION     Social History   Socioeconomic History  . Marital status: Married    Spouse name: Not on file  . Number of children: Not on file  . Years of education: Not on file  . Highest education level: Not on file  Occupational History  . Not  on file  Tobacco Use  . Smoking status: Never Smoker  . Smokeless tobacco: Never Used  Substance and Sexual Activity  . Alcohol use: Yes    Comment: social  . Drug use: No  . Sexual activity: Yes    Birth control/protection: Post-menopausal  Other Topics Concern  . Not on file  Social History Narrative  . Not on file   Social Determinants of Health   Financial Resource Strain:   . Difficulty of Paying Living Expenses: Not on file  Food Insecurity:   . Worried About Charity fundraiser in the Last Year: Not on file  . Ran Out of Food in the Last Year: Not on file  Transportation Needs:   . Lack of Transportation (Medical): Not on file  . Lack of Transportation (Non-Medical): Not on file  Physical Activity:   . Days of Exercise per Week: Not on file  . Minutes of Exercise per Session: Not on file  Stress:   . Feeling of Stress : Not on file  Social  Connections:   . Frequency of Communication with Friends and Family: Not on file  . Frequency of Social Gatherings with Friends and Family: Not on file  . Attends Religious Services: Not on file  . Active Member of Clubs or Organizations: Not on file  . Attends Archivist Meetings: Not on file  . Marital Status: Not on file   Family History  Problem Relation Age of Onset  . Cancer Mother   . Breast cancer Sister   . Aneurysm Father   . Heart attack Paternal Grandfather   . Heart attack Paternal Grandmother   . Cancer Maternal Grandfather        skin  . Suicidality Daughter   . Alcohol abuse Daughter    Outpatient Encounter Medications as of 08/04/2019  Medication Sig  . calcium-vitamin D (OSCAL WITH D) 500-200 MG-UNIT tablet Take 1 tablet by mouth 2 (two) times daily. (Patient not taking: Reported on 07/01/2019)  . cholestyramine (QUESTRAN) 4 g packet Take 1 packet (4 g total) by mouth 2 (two) times daily. (Patient taking differently: Take 4 g by mouth 2 (two) times daily as needed (for stomach). )  . dicyclomine  (BENTYL) 10 MG capsule Take 10 mg by mouth 2 (two) times daily before a meal.   . FLUoxetine (PROZAC) 10 MG tablet Take 10 mg by mouth at bedtime.   Marland Kitchen levothyroxine (SYNTHROID) 112 MCG tablet Take 1 tablet (112 mcg total) by mouth daily before breakfast.  . loperamide (IMODIUM) 2 MG capsule Take 1 capsule (2 mg total) by mouth as needed for diarrhea or loose stools. (Patient taking differently: Take 1 mg by mouth as needed for diarrhea or loose stools. )  . pantoprazole (PROTONIX) 40 MG tablet Take 1 tablet (40 mg total) by mouth 2 (two) times daily before a meal. (Patient taking differently: Take 40 mg by mouth daily as needed (for acid reflux). )  . Probiotic Product (PROBIOTIC-10 PO) Take by mouth daily.  . traZODone (DESYREL) 100 MG tablet Take 100 mg by mouth at bedtime.   . vitamin B-12 (CYANOCOBALAMIN) 500 MCG tablet Take 500 mcg by mouth daily.  . [DISCONTINUED] levothyroxine (SYNTHROID) 125 MCG tablet Take 1 tablet (125 mcg total) by mouth daily before breakfast.   No facility-administered encounter medications on file as of 08/04/2019.   ALLERGIES: Allergies  Allergen Reactions  . Codeine Nausea And Vomiting  . Other Other (See Comments)    Surgical glue-itchy rash    VACCINATION STATUS: Immunization History  Administered Date(s) Administered  . Influenza, High Dose Seasonal PF 03/24/2017  . Tdap 12/08/2017    HPI Lacey Ramsey is 69 y.o. female who is being engaged in telehealth via telephone in follow-up for postsurgical hypothyroidism, and history of thyroid malignancy. PCP:  Renne Crigler, NP. She is currently on levothyroxine 125 mcg p.o. daily before breakfast.  She reports occasional hot flashes, and anxiety.  Her previsit thyroid function tests are consistent with slight over replacement.     Her history is such that: After appropriate work-up with fine-needle aspiration of nodular lesion in her thyroid, she underwent right thyroid lobectomy on November 24, 2018  which revealed 2.1 cm papillary thyroid cancer. 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 She has recovered very well from her surgery.  -After her last visit with me, she was offered adjuvant therapy with I-131 thyroid remnant ablation using Thyrogen which she has completed  on January 25, 2019.  Findings with physiologic uptake in the submandibular glands.  No distant metastatic disease identified.  No residual thyroid tissue seen in the neck.    She is compliant.  She denies dysphagia, shortness of breath, voice change. She has family history of various thyroid dysfunction, including hypothyroidism in her sister, no family history of thyroid malignancy. She is also on calcium supplements.  Review of Systems  Limited as above. Objective:    There were no vitals taken for this visit.  Wt Readings from Last 3 Encounters:  07/01/19 124 lb 4.8 oz (56.4 kg)  12/30/18 127 lb (57.6 kg)  11/27/18 128 lb 9.6 oz (58.3 kg)      CMP ( most recent) CMP     Component Value Date/Time   NA 142 07/30/2019 1303   NA 142 03/11/2018 1220   K 3.8 07/30/2019 1303   CL 107 07/30/2019 1303   CO2 29 07/30/2019 1303   GLUCOSE 91 07/30/2019 1303   BUN 16 07/30/2019 1303   BUN 7 (L) 03/11/2018 1220   CREATININE 0.84 07/30/2019 1303   CALCIUM 9.2 07/30/2019 1303   PROT 5.8 (L) 07/30/2019 1303   PROT 6.1 03/11/2018 1220   ALBUMIN 4.1 03/11/2018 1220   AST 16 07/30/2019 1303   ALT 9 07/30/2019 1303   ALKPHOS 95 03/11/2018 1220   BILITOT 0.4 07/30/2019 1303   BILITOT 0.4 03/11/2018 1220   GFRNONAA >60 11/27/2018 0716   GFRAA >60 11/27/2018 0716    November 24, 2018 right thyroid lobectomy: 2.1 cm papillary thyroid cancer pT2PNX November 27, 2018 left thyroid  Lobectomy: 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 difficile GDH and Toxin A/B      Status: None   Collection Time: 07/02/19  3:31 PM  Result Value Ref Range   MICRO NUMBER: [id number]    SPECIMEN QUALITY: Adequate    Source STOOL    STATUS: FINAL    GDH ANTIGEN Not Detected    TOXIN A AND B Not Detected    COMMENT      No toxigenic C. difficile detected For additional information, please refer to http://education.[facility].com/faq/FAQ136 (This link is being provided for informational/educational purposes only.)  Gastrointestinal Pathogen Panel PCR     Status: None   Collection Time: 07/02/19  3:31 PM  Result Value Ref Range   Campylobacter, PCR NOT DETECTED NOT DETECT   C. difficile Tox A/B, PCR NOT DETECTED NOT DETECT   E coli 0157, PCR NOT DETECTED NOT DETECT   E coli (ETEC) LT/ST PCR NOT DETECTED NOT DETECT   E coli (STEC) stx1/stx2, PCR NOT DETECTED NOT DETECT   Salmonella, PCR NOT DETECTED NOT DETECT   Shigella, PCR NOT DETECTED NOT DETECT   Norovirus, PCR NOT DETECTED NOT DETECT   Rotavirus A, PCR NOT DETECTED NOT DETECT   Giardia lamblia, PCR NOT DETECTED NOT DETECT   Cryptosporidium, PCR NOT DETECTED NOT DETECT    Comment: . The xTAG(R) Gastrointestinal Pathogen Panel results are presumptive and must be confirmed by FDA-cleared tests or other acceptable reference methods. The results of this test should not be used as the sole basis for diagnosis, treatment, or other patient management decisions. . Performed using the [employer] xTAG(R) Gastrointestinal Pathogen Panel test kit.   Helicobacter pylori special antigen     Status: None   Collection Time: 07/02/19  3:31 PM  Result Value Ref Range  MICRO NUMBER: [id number]    SPECIMEN QUALITY Adequate    SOURCE: STOOL    STATUS: FINAL    RESULT:      Not Detected  Antimicrobials, proton pump inhibitors, and bismuth preparations inhibit H. pylori and ingestion up to two weeks prior to testing may cause false negative results. If clinically indicated the test should be repeated on a new specimen   obtained two weeks after discontinuing treatment.     Comment: Reference Range:Not Detected  T4, Free     Status: Abnormal   Collection Time: 07/30/19  1:03 PM  Result Value Ref Range   Free T4 2.0 (H) 0.8 - 1.8 ng/dL  Comprehensive metabolic panel     Status: Abnormal   Collection Time: 07/30/19  1:03 PM  Result Value Ref Range   Glucose, Bld 91 65 - 139 mg/dL    Comment: .        Non-fasting reference interval .    BUN 16 7 - 25 mg/dL   Creat 0.84 0.60 - 0.93 mg/dL    Comment: For patients >71 years of age, the reference limit for Creatinine is approximately 13% higher for people identified as African-American. .    BUN/Creatinine Ratio NOT APPLICABLE 6 - 22 (calc)   Sodium 142 135 - 146 mmol/L   Potassium 3.8 3.5 - 5.3 mmol/L   Chloride 107 98 - 110 mmol/L   CO2 29 20 - 32 mmol/L   Calcium 9.2 8.6 - 10.4 mg/dL   Total Protein 5.8 (L) 6.1 - 8.1 g/dL   Albumin 3.7 3.6 - 5.1 g/dL   Globulin 2.1 1.9 - 3.7 g/dL (calc)   AG Ratio 1.8 1.0 - 2.5 (calc)   Total Bilirubin 0.4 0.2 - 1.2 mg/dL   Alkaline phosphatase (APISO) 55 37 - 153 U/L   AST 16 10 - 35 U/L   ALT 9 6 - 29 U/L  TSH     Status: Abnormal   Collection Time: 07/30/19  1:03 PM  Result Value Ref Range   TSH 0.01 (L) 0.40 - 4.50 mIU/L   Thyrogen stimulated I-131 thyroid remnant ablation with post therapy whole-body scan completed on January 25, 2019  FINDINGS: There is physiologic uptake on today's study including in the submandibular glands. No distant metastatic disease identified. No residual thyroid seen in the neck. IMPRESSION: No distant metastatic disease. No residual thyroid tissue seen in the neck.   Assessment & Plan:   1. Malignant neoplasm of thyroid gland ([place]) 2. Postsurgical hypothyroidism   A) right thyroid lobectomy - on November 24, 2018 which revealed 2.1 cm papillary thyroid cancer. B)  left thyroid lobectomy on November 27, 2018 which revealed 1.8 cm follicular variant papillary thyroid  carcinoma.  C) I-131 thyroid remnant ablation on January 25, 2019 -Whole-body scan on January 25, 2019 was negative for distant metastases or residual disease.  This completes her initial intensive treatment for follicular variant papillary thyroid cancer, with some lymphovascular invasion on the left.   She will be considered for thyroid/neck ultrasound before her next visit in 6 months.     Regarding her postsurgical hypothyroidism: -Her previsit thyroid function tests are consistent with slight over replacement above and beyond the suppression needed given her history of thyroid malignancy.  I discussed and lowered her levothyroxine to 112 mcg p.o. daily before breakfast.     - We discussed about the correct intake of her thyroid hormone, on empty stomach at fasting, with water, separated by at least 30  minutes from breakfast and other medications,  and separated by more than 4 hours from calcium, iron, multivitamins, acid reflux medications (PPIs). -Patient is made aware of the fact that thyroid hormone replacement is needed for life, dose to be adjusted by periodic monitoring of thyroid function tests.   She is currently on calcium supplements, oscal, 500/200 p.o. twice daily-we will continue until labs with CMP.  - I advised her  to maintain close follow up with [facility] Georgeanna Lea, NP for primary care needs.     - Time spent on this patient care encounter:  25 minutes of which 50% was spent in  counseling and the rest reviewing  her current and  previous labs / studies and medications  doses and developing a plan for long term care. Ashawna Hanback  participated in the discussions, expressed understanding, and voiced agreement with the above plans.  All questions were answered to her satisfaction. she is encouraged to contact clinic should she have any questions or concerns prior to her return visit.   Follow up plan: Return in about 6 months (around 02/04/2020) for Follow up with  Pre-visit Labs, Thyroid / Neck Ultrasound.   Glade Lloyd, MD [facility] [address] [place], [place] [address] Phone: [phone]  Fax: [phone]     08/04/2019, 5:46 PM  This note was partially dictated with voice recognition software. Similar sounding words can be transcribed inadequately or may not  be corrected upon review.

## 2019-08-16 ENCOUNTER — Other Ambulatory Visit: Payer: Self-pay

## 2019-08-16 ENCOUNTER — Other Ambulatory Visit (HOSPITAL_COMMUNITY)
Admission: RE | Admit: 2019-08-16 | Discharge: 2019-08-16 | Disposition: A | Discharge status: Home / Self Care | Payer: Medicare Other | Source: Ambulatory Visit | Attending: Internal Medicine | Admitting: Internal Medicine

## 2019-08-16 DIAGNOSIS — Z01812 Encounter for preprocedural laboratory examination: Secondary | ICD-10-CM | POA: Insufficient documentation

## 2019-08-16 DIAGNOSIS — Z20822 Contact with and (suspected) exposure to covid-19: Secondary | ICD-10-CM | POA: Insufficient documentation

## 2019-08-16 LAB — SARS CORONAVIRUS 2 (TAT 6-24 HRS): SARS Coronavirus 2: NEGATIVE

## 2019-08-18 ENCOUNTER — Encounter (HOSPITAL_COMMUNITY)
Admission: RE | Disposition: A | Discharge status: Home / Self Care | Payer: Self-pay | Source: Home / Self Care | Attending: Internal Medicine

## 2019-08-18 ENCOUNTER — Ambulatory Visit (HOSPITAL_COMMUNITY)
Admission: RE | Admit: 2019-08-18 | Discharge: 2019-08-18 | Disposition: A | Discharge status: Home / Self Care | Payer: Medicare Other | Attending: Internal Medicine | Admitting: Internal Medicine

## 2019-08-18 ENCOUNTER — Other Ambulatory Visit: Payer: Self-pay

## 2019-08-18 ENCOUNTER — Encounter (HOSPITAL_COMMUNITY): Payer: Self-pay | Admitting: Internal Medicine

## 2019-08-18 DIAGNOSIS — K6289 Other specified diseases of anus and rectum: Secondary | ICD-10-CM | POA: Diagnosis not present

## 2019-08-18 DIAGNOSIS — D120 Benign neoplasm of cecum: Secondary | ICD-10-CM | POA: Diagnosis not present

## 2019-08-18 DIAGNOSIS — K644 Residual hemorrhoidal skin tags: Secondary | ICD-10-CM | POA: Insufficient documentation

## 2019-08-18 DIAGNOSIS — Z85850 Personal history of malignant neoplasm of thyroid: Secondary | ICD-10-CM | POA: Diagnosis not present

## 2019-08-18 DIAGNOSIS — E890 Postprocedural hypothyroidism: Secondary | ICD-10-CM | POA: Diagnosis not present

## 2019-08-18 DIAGNOSIS — K219 Gastro-esophageal reflux disease without esophagitis: Secondary | ICD-10-CM | POA: Insufficient documentation

## 2019-08-18 DIAGNOSIS — F419 Anxiety disorder, unspecified: Secondary | ICD-10-CM | POA: Insufficient documentation

## 2019-08-18 DIAGNOSIS — K5730 Diverticulosis of large intestine without perforation or abscess without bleeding: Secondary | ICD-10-CM | POA: Insufficient documentation

## 2019-08-18 DIAGNOSIS — Z86010 Personal history of colonic polyps: Secondary | ICD-10-CM | POA: Diagnosis not present

## 2019-08-18 DIAGNOSIS — Z79899 Other long term (current) drug therapy: Secondary | ICD-10-CM | POA: Insufficient documentation

## 2019-08-18 DIAGNOSIS — G4700 Insomnia, unspecified: Secondary | ICD-10-CM | POA: Diagnosis not present

## 2019-08-18 DIAGNOSIS — R197 Diarrhea, unspecified: Secondary | ICD-10-CM | POA: Diagnosis not present

## 2019-08-18 DIAGNOSIS — K589 Irritable bowel syndrome without diarrhea: Secondary | ICD-10-CM | POA: Insufficient documentation

## 2019-08-18 DIAGNOSIS — Z79890 Hormone replacement therapy (postmenopausal): Secondary | ICD-10-CM | POA: Insufficient documentation

## 2019-08-18 DIAGNOSIS — D123 Benign neoplasm of transverse colon: Secondary | ICD-10-CM | POA: Insufficient documentation

## 2019-08-18 DIAGNOSIS — E559 Vitamin D deficiency, unspecified: Secondary | ICD-10-CM

## 2019-08-18 HISTORY — PX: BIOPSY: SHX5522

## 2019-08-18 HISTORY — PX: POLYPECTOMY: SHX5525

## 2019-08-18 HISTORY — PX: COLONOSCOPY: SHX5424

## 2019-08-18 LAB — VITAMIN D 25 HYDROXY (VIT D DEFICIENCY, FRACTURES): Vit D, 25-Hydroxy: 36.38 ng/mL (ref 30–100)

## 2019-08-18 SURGERY — COLONOSCOPY
Anesthesia: Moderate Sedation

## 2019-08-18 MED ORDER — MEPERIDINE HCL 50 MG/ML IJ SOLN
INTRAMUSCULAR | Status: AC
  Filled 2019-08-18: qty 1

## 2019-08-18 MED ORDER — SODIUM CHLORIDE 0.9 % IV SOLN: INTRAVENOUS | Status: DC

## 2019-08-18 MED ORDER — MEPERIDINE HCL 50 MG/ML IJ SOLN
INTRAMUSCULAR | Status: DC | PRN
  Administered 2019-08-18 (×2): 25 mg via INTRAVENOUS

## 2019-08-18 MED ORDER — MIDAZOLAM HCL 5 MG/5ML IJ SOLN
INTRAMUSCULAR | Status: AC
  Filled 2019-08-18: qty 10

## 2019-08-18 MED ORDER — BENEFIBER DRINK MIX PO PACK: 4.0000 g | PACK | Freq: Every day | ORAL | Status: AC

## 2019-08-18 MED ORDER — STERILE WATER FOR IRRIGATION IR SOLN
Status: DC | PRN
  Administered 2019-08-18: 1.5 mL

## 2019-08-18 MED ORDER — MIDAZOLAM HCL 5 MG/5ML IJ SOLN
INTRAMUSCULAR | Status: DC | PRN
  Administered 2019-08-18 (×2): 1 mg via INTRAVENOUS
  Administered 2019-08-18: 2 mg via INTRAVENOUS

## 2019-08-18 NOTE — Op Note (Addendum)
[facility] Patient Name: Lacey Ramsey Procedure Date: 08/18/2019 10:36 AM MRN: [id number] Date of Birth: [birth year] Attending MD: Hildred Laser , MD CSN: [id number] Age: 69 Admit Type: Outpatient Procedure:                Colonoscopy Indications:              Diarrhea Providers:                Hildred Laser, MD, Jeanann Lewandowsky. Sharon Seller, RN, Raphael Gibney, Technician Referring MD:             Georgeanna Lea. Weeks, NP Medicines:                Meperidine 50 mg IV, Midazolam 4 mg IV Complications:            No immediate complications. Estimated Blood Loss:     Estimated blood loss was minimal. Procedure:                Pre-Anesthesia Assessment:                           - Prior to the procedure, a History and Physical                            was performed, and patient medications and                            allergies were reviewed. The patient's tolerance of                            previous anesthesia was also reviewed. The risks                            and benefits of the procedure and the sedation                            options and risks were discussed with the patient.                            All questions were answered, and informed consent                            was obtained. Prior Anticoagulants: The patient has                            taken no previous anticoagulant or antiplatelet                            agents. ASA Grade Assessment: II - A patient with                            mild systemic disease. After reviewing the risks  and benefits, the patient was deemed in                            satisfactory condition to undergo the procedure.                           After obtaining informed consent, the colonoscope                            was passed under direct vision. Throughout the                            procedure, the patient's blood pressure, pulse, and                             oxygen saturations were monitored continuously. The                            [id number]-[id number] scope was introduced through                            the anus and advanced to the the terminal ileum,                            with identification of the appendiceal orifice and                            IC valve. The colonoscopy was performed without                            difficulty. The patient tolerated the procedure                            well. The quality of the bowel preparation was                            adequate. The ileocecal valve, appendiceal orifice,                            and rectum were photographed. Scope In: 11:05:02 AM Scope Out: 11:28:05 AM Scope Withdrawal Time: 0 hours 17 minutes 10 seconds  Total Procedure Duration: 0 hours 23 minutes 3 seconds  Findings:      The perianal and digital rectal examinations were normal.      A few diverticula were found in the entire colon.      The terminal ileum appeared normal. Limited view.      Three polyps were found in the hepatic flexure and cecum. The polyps       were small in size. These were biopsied with a cold forceps for       histology. The pathology specimen was placed into Bottle Number 1.      The exam was otherwise normal throughout the examined colon.      Biopsies for histology were taken with a cold forceps from the ascending       colon  and sigmoid colon for evaluation of microscopic colitis. The       pathology specimen was placed into Bottle Number 2.      External hemorrhoids were found during retroflexion. The hemorrhoids       were small.      Anal papilla(e) were hypertrophied. Impression:               - Diverticulosis in the entire examined colon.                           - The examined portion of the ileum was normal.                           - Three small polyps at the hepatic flexure and in                            the cecum. Biopsied.                           - External  hemorrhoids.                           - Anal papilla(e) were hypertrophied.                           - Biopsies were taken with a cold forceps from the                            ascending colon and sigmoid colon for evaluation of                            microscopic colitis. Moderate Sedation:      Moderate (conscious) sedation was administered by the endoscopy nurse       and supervised by the endoscopist. The following parameters were       monitored: oxygen saturation, heart rate, blood pressure, CO2       capnography and response to care. Total physician intraservice time was       27 minutes. Recommendation:           - Patient has a contact number available for                            emergencies. The signs and symptoms of potential                            delayed complications were discussed with the                            patient. Return to normal activities tomorrow.                            Written discharge instructions were provided to the                            patient.                           -  High fiber diet today.                           - Continue present medications but stop                            Cholestyramine.                           - Benefiber 4 g po qhs.                           - Check Vit D level today.                           - No aspirin, ibuprofen, naproxen, or other                            non-steroidal anti-inflammatory drugs for 1 day.                           - Await pathology results.                           - Repeat colonoscopy is recommended. The                            colonoscopy date will be determined after pathology                            results from today's exam become available for                            review. Procedure Code(s):        --- Professional ---                           [id number], Colonoscopy, flexible; with biopsy, single                            or multiple                            99153, Moderate sedation; each additional 15                            minutes intraservice time                           G0500, Moderate sedation services provided by the                            same physician or other qualified health care                            professional performing a gastrointestinal  endoscopic service that sedation supports,                            requiring the presence of an independent trained                            observer to assist in the monitoring of the                            patient's level of consciousness and physiological                            status; initial 15 minutes of intra-service time;                            patient age 53 years or older (additional time may                            be reported with [phone], as appropriate) Diagnosis Code(s):        --- Professional ---                           K64.4, Residual hemorrhoidal skin tags                           K63.5, Polyp of colon                           K62.89, Other specified diseases of anus and rectum                           R19.7, Diarrhea, unspecified                           K57.30, Diverticulosis of large intestine without                            perforation or abscess without bleeding CPT copyright 2019 [employer]. All rights reserved. The codes documented in this report are preliminary and upon coder review may  be revised to meet current compliance requirements. Hildred Laser, MD Hildred Laser, MD 08/18/2019 11:44:56 AM This report has been signed electronically. Number of Addenda: 0

## 2019-08-18 NOTE — Discharge Instructions (Signed)
No aspirin or NSAIDs for 24 hours. Discontinue cholestyramine. Resume other medications as before. Consider taking dicyclomine on schedule. Benefiber or equivalent 4 g by mouth daily at bedtime. High-fiber diet as tolerated. No driving for 24 hours. Physician will call with results of biopsy and blood test.      Colonoscopy, Adult, Care After This sheet gives you information about how to care for yourself after your procedure. Your doctor may also give you more specific instructions. If you have problems or questions, call your doctor. What can I expect after the procedure? After the procedure, it is common to have:  A small amount of blood in your poop (stool) for 24 hours.  Some gas.  Mild cramping or bloating in your belly (abdomen). Follow these instructions at home: Eating and drinking   Drink enough fluid to keep your pee (urine) pale yellow.  Follow instructions from your doctor about what you cannot eat or drink.  Return to your normal diet as told by your doctor. Avoid heavy or fried foods that are hard to digest. Activity  Rest as told by your doctor.  Do not sit for a long time without moving. Get up to take short walks every 1-2 hours. This is important. Ask for help if you feel weak or unsteady.  Return to your normal activities as told by your doctor. Ask your doctor what activities are safe for you. To help cramping and bloating:   Try walking around.  Put heat on your belly as told by your doctor. Use the heat source that your doctor recommends, such as a moist heat pack or a heating pad. ? Put a towel between your skin and the heat source. ? Leave the heat on for 20-30 minutes. ? Remove the heat if your skin turns bright red. This is very important if you are unable to feel pain, heat, or cold. You may have a greater risk of getting burned. General instructions  For the first 24 hours after the procedure: ? Do not drive or use machinery. ? Do not  sign important documents. ? Do not drink alcohol. ? Do your daily activities more slowly than normal. ? Eat foods that are soft and easy to digest.  Take over-the-counter or prescription medicines only as told by your doctor.  Keep all follow-up visits as told by your doctor. This is important. Contact a doctor if:  You have blood in your poop 2-3 days after the procedure. Get help right away if:  You have more than a small amount of blood in your poop.  You see large clumps of tissue (blood clots) in your poop.  Your belly is swollen.  You feel like you may vomit (nauseous).  You vomit.  You have a fever.  You have belly pain that gets worse, and medicine does not help your pain. Summary  After the procedure, it is common to have a small amount of blood in your poop. You may also have mild cramping and bloating in your belly.  For the first 24 hours after the procedure, do not drive or use machinery, do not sign important documents, and do not drink alcohol.  Get help right away if you have a lot of blood in your poop, feel like you may vomit, have a fever, or have more belly pain. This information is not intended to replace advice given to you by your health care provider. Make sure you discuss any questions you have with your health care provider.  Document Revised: 12/07/2018 Document Reviewed: 12/07/2018 Elsevier Patient Education  [employer].     High-Fiber Diet Fiber, also called dietary fiber, is a type of carbohydrate that is found in fruits, vegetables, whole grains, and beans. A high-fiber diet can have many health benefits. Your health care provider may recommend a high-fiber diet to help:  Prevent constipation. Fiber can make your bowel movements more regular.  Lower your cholesterol.  Relieve the following conditions: ? Swelling of veins in the anus (hemorrhoids). ? Swelling and irritation (inflammation) of specific areas of the digestive tract  (uncomplicated diverticulosis). ? A problem of the large intestine (colon) that sometimes causes pain and diarrhea (irritable bowel syndrome, IBS).  Prevent overeating as part of a weight-loss plan.  Prevent heart disease, type 2 diabetes, and certain cancers. What is my plan? The recommended daily fiber intake in grams (g) includes:  38 g for men age 98 or younger.  30 g for men over age 85.  24 g for women age 53 or younger.  21 g for women over age 6. You can get the recommended daily intake of dietary fiber by:  Eating a variety of fruits, vegetables, grains, and beans.  Taking a fiber supplement, if it is not possible to get enough fiber through your diet. What do I need to know about a high-fiber diet?  It is better to get fiber through food sources rather than from fiber supplements. There is not a lot of research about how effective supplements are.  Always check the fiber content on the nutrition facts label of any prepackaged food. Look for foods that contain 5 g of fiber or more per serving.  Talk with a diet and nutrition specialist (dietitian) if you have questions about specific foods that are recommended or not recommended for your medical condition, especially if those foods are not listed below.  Gradually increase how much fiber you consume. If you increase your intake of dietary fiber too quickly, you may have bloating, cramping, or gas.  Drink plenty of water. Water helps you to digest fiber. What are tips for following this plan?  Eat a wide variety of high-fiber foods.  Make sure that half of the grains that you eat each day are whole grains.  Eat breads and cereals that are made with whole-grain flour instead of refined flour or white flour.  Eat brown rice, bulgur wheat, or millet instead of white rice.  Start the day with a breakfast that is high in fiber, such as a cereal that contains 5 g of fiber or more per serving.  Use beans in place of meat  in soups, salads, and pasta dishes.  Eat high-fiber snacks, such as berries, raw vegetables, nuts, and popcorn.  Choose whole fruits and vegetables instead of processed forms like juice or sauce. What foods can I eat?  Fruits Berries. Pears. Apples. Oranges. Avocado. Prunes and raisins. Dried figs. Vegetables Sweet potatoes. Spinach. Kale. Artichokes. Cabbage. Broccoli. Cauliflower. Green peas. Carrots. Squash. Grains Whole-grain breads. Multigrain cereal. Oats and oatmeal. Brown rice. Barley. Bulgur wheat. [place]. Quinoa. Bran muffins. Popcorn. Rye wafer crackers. Meats and other proteins Navy, kidney, and pinto beans. Soybeans. Split peas. Lentils. Nuts and seeds. Dairy Fiber-fortified yogurt. Beverages Fiber-fortified soy milk. Fiber-fortified orange juice. Other foods Fiber bars. The items listed above may not be a complete list of recommended foods and beverages. Contact a dietitian for more options. What foods are not recommended? Fruits Fruit juice. Cooked, strained fruit. Vegetables [place]  potatoes. Canned vegetables. Well-cooked vegetables. Grains White bread. Pasta made with refined flour. White rice. Meats and other proteins Fatty cuts of meat. Fried chicken or fried fish. Dairy Milk. Yogurt. Cream cheese. Sour cream. Fats and oils Butters. Beverages Soft drinks. Other foods Cakes and pastries. The items listed above may not be a complete list of foods and beverages to avoid. Contact a dietitian for more information. Summary  Fiber is a type of carbohydrate. It is found in fruits, vegetables, whole grains, and beans.  There are many health benefits of eating a high-fiber diet, such as preventing constipation, lowering blood cholesterol, helping with weight loss, and reducing your risk of heart disease, diabetes, and certain cancers.  Gradually increase your intake of fiber. Increasing too fast can result in cramping, bloating, and gas. Drink plenty of water  while you increase your fiber.  The best sources of fiber include whole fruits and vegetables, whole grains, nuts, seeds, and beans. This information is not intended to replace advice given to you by your health care provider. Make sure you discuss any questions you have with your health care provider. Document Revised: 03/17/2017 Document Reviewed: 03/17/2017 Elsevier Patient Education  2020 [employer].

## 2019-08-18 NOTE — H&P (Signed)
Lacey Ramsey is an 69 y.o. female.   Chief Complaint: Patient is here for colonoscopy. HPI: Patient is 69 year old Caucasian female who is long history of IBS with diarrhea who has been experiencing diarrhea for the last few weeks.  She has been passing a lot of mucus.  Stool caliber is also small.  She takes medications on schedule few grams constipated.  She denies nocturnal bowel movements anorexia or weight loss.  Last colonoscopy was about 5 years ago with removal of 2 polyps and PSA were hyperplastic. Family history is negative for IBD or CRC  Past Medical History:  Diagnosis Date  . Anxiety   . Cancer ([place])    Thyroid   . GERD (gastroesophageal reflux disease)   . Hypothyroidism   . IBS (irritable bowel syndrome)   . Insomnia   . Thyroid mass    right    Past Surgical History:  Procedure Laterality Date  . APPENDECTOMY    . CHOLECYSTECTOMY N/A 05/13/2017   Procedure: LAPAROSCOPIC CHOLECYSTECTOMY;  Surgeon: Olean Ree, MD;  Location: [facility] ORS;  Service: General;  Laterality: N/A;  . ERCP N/A 11/17/2017   Procedure: ENDOSCOPIC RETROGRADE CHOLANGIOPANCREATOGRAPHY (ERCP);  Surgeon: Lucilla Lame, MD;  Location: [facility] ENDOSCOPY;  Service: Endoscopy;  Laterality: N/A;  . HERNIA REPAIR    . KNEE ARTHROSCOPY    . THYROIDECTOMY Right 11/24/2018   Procedure: RIGHT HEMI THYROIDECTOMY;  Surgeon: Leta Baptist, MD;  Location: [facility];  Service: ENT;  Laterality: Right;  . THYROIDECTOMY Left 11/27/2018  . THYROIDECTOMY N/A 11/27/2018   Procedure: COMPLETION THYROIDECTOMY;  Surgeon: Leta Baptist, MD;  Location: [facility] OR;  Service: ENT;  Laterality: N/A;  . TONSILLECTOMY    . TUBAL LIGATION      Family History  Problem Relation Age of Onset  . Cancer Mother   . Breast cancer Sister   . Aneurysm Father   . Heart attack Paternal Grandfather   . Heart attack Paternal Grandmother   . Cancer Maternal Grandfather        skin  . Suicidality Daughter   . Alcohol abuse Daughter    . Colon cancer Neg Hx    Social History:  reports that she has never smoked. She has never used smokeless tobacco. She reports current alcohol use. She reports that she does not use drugs.  Allergies:  Allergies  Allergen Reactions  . Codeine Nausea And Vomiting  . Other Other (See Comments)    Surgical glue-itchy rash    Medications Prior to Admission  Medication Sig Dispense Refill  . acetaminophen (TYLENOL) 500 MG tablet Take 500-1,000 mg by mouth every 6 (six) hours as needed for moderate pain or headache.    . Carboxymethylcellul-Glycerin (LUBRICATING EYE DROPS OP) Place 1 drop into both eyes daily as needed (dry eyes).    Marland Kitchen dicyclomine (BENTYL) 10 MG capsule Take 10 mg by mouth 2 (two) times daily before a meal.     . FLUoxetine (PROZAC) 10 MG tablet Take 10 mg by mouth at bedtime.   3  . levothyroxine (SYNTHROID) 112 MCG tablet Take 1 tablet (112 mcg total) by mouth daily before breakfast. 30 tablet 6  . loperamide (IMODIUM) 2 MG capsule Take 1 capsule (2 mg total) by mouth as needed for diarrhea or loose stools. 30 capsule 0  . pantoprazole (PROTONIX) 40 MG tablet Take 1 tablet (40 mg total) by mouth 2 (two) times daily before a meal. (Patient taking differently: Take 40 mg by mouth 2 (two) times  daily as needed (for acid reflux). ) 60 tablet 6  . traZODone (DESYREL) 100 MG tablet Take 100 mg by mouth at bedtime.     . cholestyramine (QUESTRAN) 4 g packet Take 1 packet (4 g total) by mouth 2 (two) times daily. (Patient taking differently: Take 4 g by mouth 2 (two) times daily as needed (IBS symptoms). ) 60 packet 6  . Probiotic Product (PROBIOTIC-10 PO) Take 1 capsule by mouth daily.     . vitamin B-12 (CYANOCOBALAMIN) 500 MCG tablet Take 500 mcg by mouth daily.      No results found for this or any previous visit (from the past 48 hour(s)). No results found.  Review of Systems  Blood pressure (!) 133/56, pulse 61, temperature 98.1 F (36.7 C), temperature source Oral,  resp. rate 14, height 5\' 2"  (1.575 m), weight 53.1 kg, SpO2 100 %. Physical Exam  Constitutional: She appears well-developed and well-nourished.  HENT:  Mouth/Throat: Oropharynx is clear and moist.  Eyes: Conjunctivae are normal. No scleral icterus.  Neck: No thyromegaly present.  Cardiovascular: Normal rate, regular rhythm and normal heart sounds.  No murmur heard. Respiratory: Effort normal and breath sounds normal.  GI:  Abdomen is flat with lower midline and appendectomy scars.  On palpation is soft and nontender with organomegaly or masses.  Musculoskeletal:        General: No edema.  Lymphadenopathy:    She has no cervical adenopathy.  Neurological: She is alert.  Skin: Skin is warm and dry.     Assessment/Plan Change in bowel habits with diarrhea and change in caliber of stools. Diagnostic colonoscopy.  Hildred Laser, MD 08/18/2019, 10:55 AM

## 2019-08-19 LAB — SURGICAL PATHOLOGY

## 2019-08-20 DIAGNOSIS — K589 Irritable bowel syndrome without diarrhea: Secondary | ICD-10-CM | POA: Diagnosis not present

## 2019-08-20 DIAGNOSIS — K219 Gastro-esophageal reflux disease without esophagitis: Secondary | ICD-10-CM | POA: Diagnosis not present

## 2019-08-20 DIAGNOSIS — E782 Mixed hyperlipidemia: Secondary | ICD-10-CM | POA: Diagnosis not present

## 2019-08-20 DIAGNOSIS — E039 Hypothyroidism, unspecified: Secondary | ICD-10-CM | POA: Diagnosis not present

## 2019-08-20 DIAGNOSIS — C73 Malignant neoplasm of thyroid gland: Secondary | ICD-10-CM | POA: Diagnosis not present

## 2019-08-20 DIAGNOSIS — F419 Anxiety disorder, unspecified: Secondary | ICD-10-CM | POA: Diagnosis not present

## 2019-08-23 ENCOUNTER — Telehealth (INDEPENDENT_AMBULATORY_CARE_PROVIDER_SITE_OTHER): Payer: Self-pay | Admitting: Internal Medicine

## 2019-08-23 NOTE — Telephone Encounter (Signed)
Patient called over the weekend regarding test results

## 2019-08-26 NOTE — Telephone Encounter (Signed)
Results are available , will Dr.Rehman know.

## 2019-08-26 NOTE — Telephone Encounter (Signed)
Per Dr.Rehman he has called the patient three times and she has not answered her phone. He will try one more time.

## 2019-09-16 ENCOUNTER — Telehealth (INDEPENDENT_AMBULATORY_CARE_PROVIDER_SITE_OTHER): Payer: Self-pay | Admitting: Internal Medicine

## 2019-09-16 NOTE — Telephone Encounter (Signed)
Patient called stated she had a colonoscopy about 3 weeks ago and she is still having stomach issues - she thinks it may have something to do with a hernia - please advise - [place]

## 2019-09-20 NOTE — Telephone Encounter (Signed)
Per Dr.Rehman call the patient and get more information. 

## 2019-09-22 ENCOUNTER — Telehealth (INDEPENDENT_AMBULATORY_CARE_PROVIDER_SITE_OTHER): Payer: Self-pay | Admitting: Internal Medicine

## 2019-09-22 NOTE — Telephone Encounter (Signed)
Patient called the office stated she has been taking benefiber - she has constipation then she has diarrhea - she has stomach pain just like before when she found out she has a hernia - patient was told Dr Laural Golden is out of the office until 5/4  - she may not get a call until after that date

## 2019-09-22 NOTE — Telephone Encounter (Signed)
This will be reviewed with Dr.Rehman , patient will be called with his recommendation.

## 2019-09-22 NOTE — Telephone Encounter (Signed)
I have called the patient at the number that she left. [phone]. Unable to leave a message. Will try again.

## 2019-09-23 NOTE — Telephone Encounter (Signed)
This has been added to the other telephone note.

## 2019-09-24 NOTE — Telephone Encounter (Signed)
Per Dr.Rehman the patient will need to come to the office for visit. He ask for a work in next Thursday. She will need to have assessment. Uncertain as to where the hernia is.

## 2019-09-30 ENCOUNTER — Ambulatory Visit (INDEPENDENT_AMBULATORY_CARE_PROVIDER_SITE_OTHER): Payer: Medicare Other | Admitting: Internal Medicine

## 2019-09-30 ENCOUNTER — Encounter (INDEPENDENT_AMBULATORY_CARE_PROVIDER_SITE_OTHER): Payer: Self-pay | Admitting: Internal Medicine

## 2019-09-30 ENCOUNTER — Other Ambulatory Visit (INDEPENDENT_AMBULATORY_CARE_PROVIDER_SITE_OTHER): Payer: Self-pay | Admitting: *Deleted

## 2019-09-30 ENCOUNTER — Other Ambulatory Visit: Payer: Self-pay

## 2019-09-30 ENCOUNTER — Encounter (INDEPENDENT_AMBULATORY_CARE_PROVIDER_SITE_OTHER): Payer: Self-pay | Admitting: *Deleted

## 2019-09-30 VITALS — BP 138/72 | HR 56 | Temp 97.2°F | Ht 62.0 in | Wt 118.0 lb

## 2019-09-30 DIAGNOSIS — Z01812 Encounter for preprocedural laboratory examination: Secondary | ICD-10-CM

## 2019-09-30 DIAGNOSIS — R109 Unspecified abdominal pain: Secondary | ICD-10-CM | POA: Diagnosis not present

## 2019-09-30 DIAGNOSIS — K580 Irritable bowel syndrome with diarrhea: Secondary | ICD-10-CM | POA: Diagnosis not present

## 2019-09-30 MED ORDER — RIFAXIMIN 550 MG PO TABS: 550.0000 mg | ORAL_TABLET | Freq: Three times a day (TID) | ORAL | 0 refills | Status: DC

## 2019-09-30 NOTE — Progress Notes (Signed)
Presenting complaint;  Diarrhea and left upper quadrant abdominal pain.  Database and subjective:  Patient is 69 year old Caucasian female who has history of IBS with noted change in her bowel movements and caliber and underwent colonoscopy on 08/18/2019.  Terminal ileum was normal.  She has scattered diverticuli throughout the colon.  She had 3 small polyps and these were tubular adenomas.  Random biopsy from ascending and left colon did not reveal any changes of lymphocytic or collagenous colitis.  Patient reported improvement with fiber supplement and she did not take dicyclomine.  However she began to have diarrhea cramping and decided to take dicyclomine but she says it is not helping.  She is not having any side effects with this medication. She also complains of postprandial pain in her left upper quadrant and epigastric region which is excruciating and may last for up to an hour.  She says this pain reminds her of the pain that she was having before she had paraesophageal hernia fixed few years ago.  She is having nausea but no vomiting.  She denies melena or rectal bleeding.  She is using Imodium on as-needed basis.  She does not like to take it regularly because she become constipated.  She feels heartburn is well controlled with pantoprazole.   Current Medications: Outpatient Encounter Medications as of 09/30/2019  Medication Sig  . acetaminophen (TYLENOL) 500 MG tablet Take 500-1,000 mg by mouth every 6 (six) hours as needed for moderate pain or headache.  . Carboxymethylcellul-Glycerin (LUBRICATING EYE DROPS OP) Place 1 drop into both eyes daily as needed (dry eyes).  Marland Kitchen dicyclomine (BENTYL) 10 MG capsule Take 10 mg by mouth 2 (two) times daily before a meal.   . FLUoxetine (PROZAC) 10 MG tablet Take 10 mg by mouth at bedtime.   Marland Kitchen levothyroxine (SYNTHROID) 112 MCG tablet Take 1 tablet (112 mcg total) by mouth daily before breakfast.  . loperamide (IMODIUM) 2 MG capsule Take 1 capsule (2 mg  total) by mouth as needed for diarrhea or loose stools.  . pantoprazole (PROTONIX) 40 MG tablet Take 1 tablet (40 mg total) by mouth 2 (two) times daily before a meal. (Patient taking differently: Take 40 mg by mouth 2 (two) times daily as needed (for acid reflux). )  . Probiotic Product (PROBIOTIC-10 PO) Take 1 capsule by mouth daily.   . traZODone (DESYREL) 100 MG tablet Take 100 mg by mouth at bedtime.   . Wheat Dextrin (BENEFIBER DRINK MIX) PACK Take 4 g by mouth at bedtime.  . [DISCONTINUED] vitamin B-12 (CYANOCOBALAMIN) 500 MCG tablet Take 500 mcg by mouth daily.   No facility-administered encounter medications on file as of 09/30/2019.     Objective: Blood pressure 138/72, pulse (!) 56, temperature (!) 97.2 F (36.2 C), temperature source Temporal, height 5\' 2"  (1.575 m), weight 118 lb (53.5 kg). Patient is alert and in no acute distress. Patient is wearing facial mask. Conjunctiva is pink. Sclera is nonicteric Oropharyngeal mucosa is normal. No neck masses or thyromegaly noted. Cardiac exam with regular rhythm normal S1 and S2. No murmur or gallop noted. Lungs are clear to auscultation. Abdomen is symmetrical.  Bowel sounds are normal.  On palpation abdomen is soft.  She has mild tenderness at LLQ and LUQ.  No guarding.  No organomegaly or masses. No LE edema or clubbing noted.   Assessment:  #1.  Diarrhea.  Suspect diarrhea secondary to irritable bowel syndrome.  She is not responding to low-dose dicyclomine.  I would like to  try Xifaxan before considering higher dose of antispasmodic or other therapies.  #2.  Left upper quadrant abdominal pain.  She has a history of repair of paraesophageal hernia and this pain reminds her of the pain that she had been.  Therefore need to rule out recurrence.   Plan:  Patient advised to take Imodium OTC 1 or 2 mg by mouth every morning and take second dose on as-needed basis. Abdominal CT to be scheduled. Xifaxan 550 mg by mouth 3 times a  day after each meal for 2 weeks.  Patient was given samples. Further recommendations to follow.

## 2019-09-30 NOTE — Patient Instructions (Signed)
Abdominal CT to be scheduled. Take Xifaxan 550 mg after each meal for 2 weeks. Take Imodium OTC 1 to 2 mg by mouth every morning and second dose on as-needed basis.

## 2019-10-01 LAB — CREATININE, SERUM
Creatinine, Ser: 0.92 mg/dL (ref 0.57–1.00)
GFR calc Af Amer: 73 mL/min/{1.73_m2} (ref >59)
GFR calc non Af Amer: 63 mL/min/{1.73_m2} (ref >59)

## 2019-10-11 DIAGNOSIS — C44619 Basal cell carcinoma of skin of left upper limb, including shoulder: Secondary | ICD-10-CM | POA: Diagnosis not present

## 2019-10-11 DIAGNOSIS — B078 Other viral warts: Secondary | ICD-10-CM | POA: Diagnosis not present

## 2019-10-12 DIAGNOSIS — H838X3 Other specified diseases of inner ear, bilateral: Secondary | ICD-10-CM | POA: Diagnosis not present

## 2019-10-12 DIAGNOSIS — H903 Sensorineural hearing loss, bilateral: Secondary | ICD-10-CM | POA: Diagnosis not present

## 2019-10-12 DIAGNOSIS — H6123 Impacted cerumen, bilateral: Secondary | ICD-10-CM | POA: Diagnosis not present

## 2019-10-12 DIAGNOSIS — Z85850 Personal history of malignant neoplasm of thyroid: Secondary | ICD-10-CM | POA: Diagnosis not present

## 2019-10-13 ENCOUNTER — Ambulatory Visit (HOSPITAL_COMMUNITY)
Admission: RE | Admit: 2019-10-13 | Discharge: 2019-10-13 | Disposition: A | Discharge status: Home / Self Care | Payer: Medicare Other | Source: Ambulatory Visit | Attending: Internal Medicine | Admitting: Internal Medicine

## 2019-10-13 ENCOUNTER — Other Ambulatory Visit: Payer: Self-pay

## 2019-10-13 DIAGNOSIS — R1012 Left upper quadrant pain: Secondary | ICD-10-CM | POA: Diagnosis not present

## 2019-10-13 DIAGNOSIS — R109 Unspecified abdominal pain: Secondary | ICD-10-CM | POA: Insufficient documentation

## 2019-10-13 DIAGNOSIS — K838 Other specified diseases of biliary tract: Secondary | ICD-10-CM | POA: Diagnosis not present

## 2019-10-13 MED ORDER — IOHEXOL 300 MG/ML  SOLN
100.0000 mL | Freq: Once | INTRAMUSCULAR | Status: AC | PRN
  Administered 2019-10-13: 100 mL via INTRAVENOUS

## 2019-11-03 ENCOUNTER — Telehealth (INDEPENDENT_AMBULATORY_CARE_PROVIDER_SITE_OTHER): Payer: Self-pay | Admitting: Internal Medicine

## 2019-11-03 NOTE — Telephone Encounter (Signed)
Please let the patient know that they are on his desk. Once he has reviewed , he will call with recommendation or he will have me too.

## 2019-11-03 NOTE — Telephone Encounter (Signed)
Patient left voice mail message stating NUR had her to send in a list of her bowel movements - stated it has been a week and she hasn't heard anything - please advise - ph# [phone]

## 2019-11-09 ENCOUNTER — Telehealth (INDEPENDENT_AMBULATORY_CARE_PROVIDER_SITE_OTHER): Payer: Self-pay | Admitting: Internal Medicine

## 2019-11-09 NOTE — Telephone Encounter (Signed)
Patient returned your call - ph# [phone]

## 2019-11-30 ENCOUNTER — Ambulatory Visit (INDEPENDENT_AMBULATORY_CARE_PROVIDER_SITE_OTHER): Payer: Medicare Other | Admitting: Internal Medicine

## 2019-12-17 NOTE — Telephone Encounter (Signed)
I have called patient on 2 different occasions. I did leave message the first time. Unable to leave message today. Based on the stool diary that she provided she appears to be doing better. Will arrange for follow-up visit if she is still having concerns

## 2019-12-21 DIAGNOSIS — Z20822 Contact with and (suspected) exposure to covid-19: Secondary | ICD-10-CM | POA: Diagnosis not present

## 2019-12-21 DIAGNOSIS — R197 Diarrhea, unspecified: Secondary | ICD-10-CM | POA: Diagnosis not present

## 2019-12-21 DIAGNOSIS — R1030 Lower abdominal pain, unspecified: Secondary | ICD-10-CM | POA: Diagnosis not present

## 2019-12-21 DIAGNOSIS — D72819 Decreased white blood cell count, unspecified: Secondary | ICD-10-CM | POA: Diagnosis not present

## 2019-12-21 DIAGNOSIS — K529 Noninfective gastroenteritis and colitis, unspecified: Secondary | ICD-10-CM | POA: Diagnosis not present

## 2019-12-21 DIAGNOSIS — E039 Hypothyroidism, unspecified: Secondary | ICD-10-CM | POA: Diagnosis not present

## 2019-12-21 DIAGNOSIS — Z79890 Hormone replacement therapy (postmenopausal): Secondary | ICD-10-CM | POA: Diagnosis not present

## 2019-12-21 DIAGNOSIS — K838 Other specified diseases of biliary tract: Secondary | ICD-10-CM | POA: Diagnosis not present

## 2019-12-21 DIAGNOSIS — E890 Postprocedural hypothyroidism: Secondary | ICD-10-CM | POA: Diagnosis not present

## 2019-12-21 DIAGNOSIS — Z9049 Acquired absence of other specified parts of digestive tract: Secondary | ICD-10-CM | POA: Diagnosis not present

## 2019-12-21 DIAGNOSIS — Z8719 Personal history of other diseases of the digestive system: Secondary | ICD-10-CM | POA: Diagnosis not present

## 2019-12-21 DIAGNOSIS — R109 Unspecified abdominal pain: Secondary | ICD-10-CM | POA: Diagnosis not present

## 2019-12-21 DIAGNOSIS — Z9889 Other specified postprocedural states: Secondary | ICD-10-CM | POA: Diagnosis not present

## 2019-12-22 DIAGNOSIS — E039 Hypothyroidism, unspecified: Secondary | ICD-10-CM | POA: Diagnosis not present

## 2019-12-22 DIAGNOSIS — R197 Diarrhea, unspecified: Secondary | ICD-10-CM | POA: Diagnosis not present

## 2019-12-22 DIAGNOSIS — D72819 Decreased white blood cell count, unspecified: Secondary | ICD-10-CM | POA: Diagnosis not present

## 2020-01-10 ENCOUNTER — Other Ambulatory Visit: Payer: Self-pay

## 2020-01-10 DIAGNOSIS — C73 Malignant neoplasm of thyroid gland: Secondary | ICD-10-CM

## 2020-01-10 DIAGNOSIS — E890 Postprocedural hypothyroidism: Secondary | ICD-10-CM

## 2020-01-12 DIAGNOSIS — E890 Postprocedural hypothyroidism: Secondary | ICD-10-CM | POA: Diagnosis not present

## 2020-01-12 DIAGNOSIS — C73 Malignant neoplasm of thyroid gland: Secondary | ICD-10-CM | POA: Diagnosis not present

## 2020-01-17 ENCOUNTER — Telehealth: Payer: Self-pay | Admitting: "Endocrinology

## 2020-01-17 NOTE — Telephone Encounter (Signed)
Yes sooner - virtual.

## 2020-01-17 NOTE — Telephone Encounter (Signed)
Pt has done her labs but is not on the schedule until 9/14, she would like to know if she needs to be seen sooner regarding her results or if she can wait

## 2020-01-18 ENCOUNTER — Other Ambulatory Visit: Payer: Self-pay

## 2020-01-18 ENCOUNTER — Telehealth (INDEPENDENT_AMBULATORY_CARE_PROVIDER_SITE_OTHER): Payer: Medicare Other | Admitting: "Endocrinology

## 2020-01-18 ENCOUNTER — Encounter: Payer: Self-pay | Admitting: "Endocrinology

## 2020-01-18 VITALS — Ht 62.0 in | Wt 112.4 lb

## 2020-01-18 DIAGNOSIS — C73 Malignant neoplasm of thyroid gland: Secondary | ICD-10-CM

## 2020-01-18 DIAGNOSIS — E890 Postprocedural hypothyroidism: Secondary | ICD-10-CM | POA: Diagnosis not present

## 2020-01-18 MED ORDER — LEVOTHYROXINE SODIUM 75 MCG PO TABS: 75.0000 ug | ORAL_TABLET | Freq: Every day | ORAL | 0 refills | Status: AC

## 2020-01-18 NOTE — Progress Notes (Signed)
01/18/2020, 12:45 PM                                         Endocrinology Telehealth Visit Follow up Note -During COVID -19 Pandemic  This visit type was conducted  via telephone due to national recommendations for restrictions regarding the COVID-19 Pandemic  in an effort to limit this patient's exposure and mitigate transmission of the corona virus.   I connected with Chaya Dehaan on 01/18/2020   by telephone and verified that I am speaking with the correct person using two identifiers. [name], [birth year]. she has verbally consented to this visit.  I was in my office and patient was in her residence. No other persons were with me during the encounter. All issues noted in this document were discussed and addressed. The format was not optimal for physical exam.    Subjective:    Patient ID: [name], female    DOB: 12/16/1950, PCP Renne Crigler, NP   Past Medical History:  Diagnosis Date   Anxiety    Cancer [facility])    Thyroid    GERD (gastroesophageal reflux disease)    Hypothyroidism    IBS (irritable bowel syndrome)    Insomnia    Thyroid mass    right   Past Surgical History:  Procedure Laterality Date   APPENDECTOMY     BIOPSY  08/18/2019   Procedure: BIOPSY;  Surgeon: Rogene Houston, MD;  Location: [facility];  Service: Endoscopy;;   CHOLECYSTECTOMY N/A 05/13/2017   Procedure: LAPAROSCOPIC CHOLECYSTECTOMY;  Surgeon: Olean Ree, MD;  Location: [facility] ORS;  Service: General;  Laterality: N/A;   COLONOSCOPY N/A 08/18/2019   Procedure: COLONOSCOPY;  Surgeon: Rogene Houston, MD;  Location: [facility];  Service: Endoscopy;  Laterality: N/A;  1200   ERCP N/A 11/17/2017   Procedure: ENDOSCOPIC RETROGRADE CHOLANGIOPANCREATOGRAPHY (ERCP);  Surgeon: Lucilla Lame, MD;  Location: [facility] ENDOSCOPY;  Service: Endoscopy;  Laterality: N/A;   HERNIA REPAIR     KNEE ARTHROSCOPY     POLYPECTOMY   08/18/2019   Procedure: POLYPECTOMY;  Surgeon: Rogene Houston, MD;  Location: [facility];  Service: Endoscopy;;   THYROIDECTOMY Right 11/24/2018   Procedure: RIGHT HEMI THYROIDECTOMY;  Surgeon: Leta Baptist, MD;  Location: [place];  Service: ENT;  Laterality: Right;   THYROIDECTOMY Left 11/27/2018   THYROIDECTOMY N/A 11/27/2018   Procedure: COMPLETION THYROIDECTOMY;  Surgeon: Leta Baptist, MD;  Location: [facility] OR;  Service: ENT;  Laterality: N/A;   TONSILLECTOMY     TUBAL LIGATION     Social History   Socioeconomic History   Marital status: Married    Spouse name: Not on file   Number of children: Not on file   Years of education: Not on file   Highest education level: Not on file  Occupational History   Not on file  Tobacco Use   Smoking status: Never Smoker   Smokeless tobacco: Never Used  Vaping Use   Vaping Use: Never used  Substance and Sexual Activity   Alcohol use: Yes    Comment: social  Drug use: No   Sexual activity: Yes    Birth control/protection: Post-menopausal  Other Topics Concern   Not on file  Social History Narrative   Not on file   Social Determinants of Health   Financial Resource Strain:    Difficulty of Paying Living Expenses: Not on file  Food Insecurity:    Worried About [place] in the Last Year: Not on file   Ran Out of Food in the Last Year: Not on file  Transportation Needs:    Lack of Transportation (Medical): Not on file   Lack of Transportation (Non-Medical): Not on file  Physical Activity:    Days of Exercise per Week: Not on file   Minutes of Exercise per Session: Not on file  Stress:    Feeling of Stress : Not on file  Social Connections:    Frequency of Communication with Friends and Family: Not on file   Frequency of Social Gatherings with Friends and Family: Not on file   Attends Religious Services: Not on file   Active Member of Clubs or Organizations: Not on file    Attends Archivist Meetings: Not on file   Marital Status: Not on file   Family History  Problem Relation Age of Onset   Cancer Mother    Breast cancer Sister    Aneurysm Father    Heart attack Paternal Grandfather    Heart attack Paternal Grandmother    Cancer Maternal Grandfather        skin   Suicidality Daughter    Alcohol abuse Daughter    Colon cancer Neg Hx    Outpatient Encounter Medications as of 01/18/2020  Medication Sig   acetaminophen (TYLENOL) 500 MG tablet Take 500-1,000 mg by mouth every 6 (six) hours as needed for moderate pain or headache.   Carboxymethylcellul-Glycerin (LUBRICATING EYE DROPS OP) Place 1 drop into both eyes daily as needed (dry eyes).   dicyclomine (BENTYL) 10 MG capsule Take 10 mg by mouth 2 (two) times daily before a meal.    FLUoxetine (PROZAC) 10 MG tablet Take 10 mg by mouth at bedtime.    levothyroxine (SYNTHROID) 75 MCG tablet Take 1 tablet (75 mcg total) by mouth daily before breakfast.   loperamide (IMODIUM) 2 MG capsule Take 1 capsule (2 mg total) by mouth as needed for diarrhea or loose stools.   pantoprazole (PROTONIX) 40 MG tablet Take 1 tablet (40 mg total) by mouth 2 (two) times daily before a meal. (Patient taking differently: Take 40 mg by mouth 2 (two) times daily as needed (for acid reflux). )   Probiotic Product (PROBIOTIC-10 PO) Take 1 capsule by mouth daily.    traZODone (DESYREL) 100 MG tablet Take 100 mg by mouth at bedtime.    Wheat Dextrin (BENEFIBER DRINK MIX) PACK Take 4 g by mouth at bedtime.   [DISCONTINUED] levothyroxine (SYNTHROID) 112 MCG tablet Take 1 tablet (112 mcg total) by mouth daily before breakfast.   [DISCONTINUED] rifaximin (XIFAXAN) 550 MG TABS tablet Take 1 tablet (550 mg total) by mouth 3 (three) times daily.   No facility-administered encounter medications on file as of 01/18/2020.   ALLERGIES: Allergies  Allergen Reactions   Codeine Nausea And Vomiting   Flagyl  [Metronidazole]    Other Other (See Comments)    Surgical glue-itchy rash    VACCINATION STATUS: Immunization History  Administered Date(s) Administered   Influenza, High Dose Seasonal PF 03/24/2017   Tdap 12/08/2017    HPI  Annabelle Rexroad is 69 y.o. female who is being engaged in telehealth via telephone in follow-up for postsurgical hypothyroidism, and history of thyroid malignancy. PCP:  Renne Crigler, NP. She is currently on levothyroxine 112 mcg p.o. daily before breakfast.  She reports occasional hot flashes, unintended weight loss of more than 10 pounds, and anxiety.  Her previsit thyroid function tests are consistent with slight over replacement.     Her history is such that: After appropriate work-up with fine-needle aspiration of nodular lesion in her thyroid, she underwent right thyroid lobectomy on November 24, 2018 which revealed 2.1 cm papillary thyroid cancer. 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 She has recovered very well from her surgery.  -After her last visit with me, she was offered adjuvant therapy with I-131 thyroid remnant ablation using Thyrogen which she has completed on January 25, 2019.  Findings with physiologic uptake in the submandibular glands.  No distant metastatic disease identified.  No residual thyroid tissue seen in the neck.   Her next ultrasound is scheduled to be done in September 2021.   She is compliant.  She denies dysphagia, shortness of breath, voice change. She has family history of various thyroid dysfunction, including hypothyroidism in her sister, no family history of thyroid malignancy. She is also on calcium supplements.  Review of Systems  Limited as above. Objective:    Ht 5\' 2"  (1.575 m)    Wt 112 lb 6.4 oz (51 kg)    BMI 20.56 kg/m   Wt Readings from Last 3 Encounters:  01/18/20 112 lb 6.4 oz (51 kg)  09/30/19 118 lb (53.5 kg)   08/18/19 117 lb (53.1 kg)      CMP ( most recent) CMP     Component Value Date/Time   NA 142 07/30/2019 1303   NA 142 03/11/2018 1220   K 3.8 07/30/2019 1303   CL 107 07/30/2019 1303   CO2 29 07/30/2019 1303   GLUCOSE 91 07/30/2019 1303   BUN 16 07/30/2019 1303   BUN 7 (L) 03/11/2018 1220   CREATININE 0.92 09/30/2019 1210   CREATININE 0.84 07/30/2019 1303   CALCIUM 9.2 07/30/2019 1303   PROT 5.8 (L) 07/30/2019 1303   PROT 6.1 03/11/2018 1220   ALBUMIN 4.1 03/11/2018 1220   AST 16 07/30/2019 1303   ALT 9 07/30/2019 1303   ALKPHOS 95 03/11/2018 1220   BILITOT 0.4 07/30/2019 1303   BILITOT 0.4 03/11/2018 1220   GFRNONAA 63 09/30/2019 1210   GFRAA 73 09/30/2019 1210    November 24, 2018 right thyroid lobectomy: 2.1 cm papillary thyroid cancer pT2PNX November 27, 2018 left thyroid  Lobectomy: 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 pT1b, pNX   Recent Results (from the past 2160 hour(s))  TSH     Status: Abnormal   Collection Time: 01/12/20 12:13 PM  Result Value Ref Range   TSH 0.052 (L) 0.450 - 4.500 uIU/mL  T4, Free     Status: Abnormal   Collection Time: 01/12/20 12:13 PM  Result Value Ref Range   Free T4 1.93 (H) 0.82 - 1.77 ng/dL  Thyroglobulin Level     Status: None (Preliminary result)   Collection Time: 01/12/20 12:13 PM  Result Value Ref Range   Thyroglobulin (TG-RIA) WILL FOLLOW   Thyroglobulin antibody     Status: Abnormal   Collection Time: 01/12/20 12:13 PM  Result Value Ref Range   Thyroglobulin  Antibody 2.8 (H) 0.0 - 0.9 IU/mL    Comment: Thyroglobulin Antibody measured by [facility] Coulter Methodology   Thyrogen stimulated I-131 thyroid remnant ablation with post therapy whole-body scan completed on January 25, 2019  FINDINGS: There is physiologic uptake on today's study including in the submandibular glands. No distant metastatic disease identified. No residual thyroid seen in the neck. IMPRESSION: No distant metastatic disease. No residual  thyroid tissue seen in the neck.   Assessment & Plan:   1. Malignant neoplasm of thyroid gland ([place]) 2. Postsurgical hypothyroidism   A) right thyroid lobectomy - on November 24, 2018 which revealed 2.1 cm papillary thyroid cancer. 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  C) I-131 thyroid remnant ablation on January 25, 2019 -Whole-body scan on January 25, 2019 was negative for distant metastases or residual disease.  This completes her initial intensive treatment for follicular variant papillary thyroid cancer, with some lymphovascular invasion on the left.  She is scheduled to have thyroid/neck ultrasound in September 2021.  Regarding her postsurgical hypothyroidism: -Her previsit thyroid function tests are still consistent with over replacement, early due to her significant weight loss in the interval.  In light of safety, she is advised to lower her levothyroxine to 75 mcg p.o. daily before breakfast with plan to repeat thyroid function test in neck necessary adjustment in 3 months.    - We discussed about the correct intake of her thyroid hormone, on empty stomach at fasting, with water, separated by at least 30 minutes from breakfast and other medications,  and separated by more than 4 hours from calcium, iron, multivitamins, acid reflux medications (PPIs). -Patient is made aware of the fact that thyroid hormone replacement is needed for life, dose to be adjusted by periodic monitoring of thyroid function tests.  She is currently on calcium supplements, oscal, 500/200 p.o. twice daily-we will continue until labs with CMP.  - I advised her  to maintain close follow up with [facility] [place] Georgeanna Lea, NP for primary care needs.     - Time spent on this patient care encounter:  20 minutes of which 50% was spent in  counseling and the rest reviewing  her current and  previous labs / studies and medications  doses and developing a  plan for long term care. Ewelina Naves  participated in the discussions, expressed understanding, and voiced agreement with the above plans.  All questions were answered to her satisfaction. she is encouraged to contact clinic should she have any questions or concerns prior to her return visit.    Follow up plan: Return in about 9 weeks (around 03/21/2020), or Pt will also do her ultrasound out of state in [place] and will send her reports, for F/U with Pre-visit Labs.   Glade Lloyd, MD [facility] [address] [place], [place] [address] Phone: [phone]  Fax: [phone]     01/18/2020, 12:45 PM  This note was partially dictated with voice recognition software. Similar sounding words can be transcribed inadequately or may not  be corrected upon review.

## 2020-01-18 NOTE — Progress Notes (Signed)
Pt states she's experiencing worsening hair and skin dryness, nails brittle x 6-8 months . Experiencing dizziness in the mornings off & on x 1 month.

## 2020-01-21 LAB — THYROGLOBULIN LEVEL: Thyroglobulin (TG-RIA): <2 ng/mL

## 2020-01-21 LAB — T4, FREE: Free T4: 1.93 ng/dL — ABNORMAL HIGH (ref 0.82–1.77)

## 2020-01-21 LAB — THYROGLOBULIN ANTIBODY: Thyroglobulin Antibody: 2.8 IU/mL — ABNORMAL HIGH (ref 0.0–0.9)

## 2020-01-21 LAB — TSH: TSH: 0.052 u[IU]/mL — ABNORMAL LOW (ref 0.450–4.500)

## 2020-01-28 ENCOUNTER — Ambulatory Visit (HOSPITAL_COMMUNITY): Payer: Medicare Other

## 2020-02-07 DIAGNOSIS — K219 Gastro-esophageal reflux disease without esophagitis: Secondary | ICD-10-CM | POA: Diagnosis not present

## 2020-02-07 DIAGNOSIS — F419 Anxiety disorder, unspecified: Secondary | ICD-10-CM | POA: Diagnosis not present

## 2020-02-07 DIAGNOSIS — C73 Malignant neoplasm of thyroid gland: Secondary | ICD-10-CM | POA: Diagnosis not present

## 2020-02-07 DIAGNOSIS — N959 Unspecified menopausal and perimenopausal disorder: Secondary | ICD-10-CM | POA: Diagnosis not present

## 2020-02-07 DIAGNOSIS — Z1231 Encounter for screening mammogram for malignant neoplasm of breast: Secondary | ICD-10-CM | POA: Diagnosis not present

## 2020-02-07 DIAGNOSIS — Z23 Encounter for immunization: Secondary | ICD-10-CM | POA: Diagnosis not present

## 2020-02-07 DIAGNOSIS — E890 Postprocedural hypothyroidism: Secondary | ICD-10-CM | POA: Diagnosis not present

## 2020-02-07 DIAGNOSIS — K5790 Diverticulosis of intestine, part unspecified, without perforation or abscess without bleeding: Secondary | ICD-10-CM | POA: Diagnosis not present

## 2020-02-07 DIAGNOSIS — Z13220 Encounter for screening for lipoid disorders: Secondary | ICD-10-CM | POA: Diagnosis not present

## 2020-02-07 DIAGNOSIS — K580 Irritable bowel syndrome with diarrhea: Secondary | ICD-10-CM | POA: Diagnosis not present

## 2020-02-07 DIAGNOSIS — G4700 Insomnia, unspecified: Secondary | ICD-10-CM | POA: Diagnosis not present

## 2020-02-08 ENCOUNTER — Ambulatory Visit: Payer: PRIVATE HEALTH INSURANCE | Admitting: "Endocrinology

## 2020-02-09 DIAGNOSIS — E890 Postprocedural hypothyroidism: Secondary | ICD-10-CM | POA: Diagnosis not present

## 2020-02-09 DIAGNOSIS — K219 Gastro-esophageal reflux disease without esophagitis: Secondary | ICD-10-CM | POA: Diagnosis not present

## 2020-02-09 DIAGNOSIS — Z13220 Encounter for screening for lipoid disorders: Secondary | ICD-10-CM | POA: Diagnosis not present

## 2020-02-09 DIAGNOSIS — C73 Malignant neoplasm of thyroid gland: Secondary | ICD-10-CM | POA: Diagnosis not present

## 2020-02-09 DIAGNOSIS — K5790 Diverticulosis of intestine, part unspecified, without perforation or abscess without bleeding: Secondary | ICD-10-CM | POA: Diagnosis not present

## 2020-02-25 DIAGNOSIS — E890 Postprocedural hypothyroidism: Secondary | ICD-10-CM | POA: Diagnosis not present

## 2020-02-25 DIAGNOSIS — C73 Malignant neoplasm of thyroid gland: Secondary | ICD-10-CM | POA: Diagnosis not present

## 2020-03-09 DIAGNOSIS — Z1231 Encounter for screening mammogram for malignant neoplasm of breast: Secondary | ICD-10-CM | POA: Diagnosis not present

## 2020-03-09 DIAGNOSIS — M810 Age-related osteoporosis without current pathological fracture: Secondary | ICD-10-CM | POA: Diagnosis not present

## 2020-03-09 DIAGNOSIS — Z803 Family history of malignant neoplasm of breast: Secondary | ICD-10-CM | POA: Diagnosis not present

## 2020-03-22 ENCOUNTER — Telehealth: Payer: Medicare Other | Admitting: "Endocrinology

## 2020-04-25 DIAGNOSIS — Z85850 Personal history of malignant neoplasm of thyroid: Secondary | ICD-10-CM | POA: Diagnosis not present

## 2020-04-25 DIAGNOSIS — C73 Malignant neoplasm of thyroid gland: Secondary | ICD-10-CM | POA: Diagnosis not present

## 2020-04-25 DIAGNOSIS — K219 Gastro-esophageal reflux disease without esophagitis: Secondary | ICD-10-CM | POA: Diagnosis not present

## 2020-04-25 DIAGNOSIS — E890 Postprocedural hypothyroidism: Secondary | ICD-10-CM | POA: Diagnosis not present

## 2020-04-25 DIAGNOSIS — Z13220 Encounter for screening for lipoid disorders: Secondary | ICD-10-CM | POA: Diagnosis not present

## 2020-04-25 DIAGNOSIS — K5790 Diverticulosis of intestine, part unspecified, without perforation or abscess without bleeding: Secondary | ICD-10-CM | POA: Diagnosis not present

## 2020-05-05 DIAGNOSIS — M810 Age-related osteoporosis without current pathological fracture: Secondary | ICD-10-CM | POA: Diagnosis not present

## 2020-05-05 DIAGNOSIS — E039 Hypothyroidism, unspecified: Secondary | ICD-10-CM | POA: Diagnosis not present

## 2020-05-05 DIAGNOSIS — F419 Anxiety disorder, unspecified: Secondary | ICD-10-CM | POA: Diagnosis not present

## 2020-05-05 DIAGNOSIS — K580 Irritable bowel syndrome with diarrhea: Secondary | ICD-10-CM | POA: Diagnosis not present

## 2020-06-12 IMAGING — NM NM [ID] THYROID CANCER METS SP CA TX
4 series · 4 of 4 positions shown · non-contrast
Comparison: None.

CLINICAL DATA: Status post I 131 therapy for thyroid cancer. Post
thyroidectomy.

EXAM:
NUCLEAR MEDICINE 5-DUD POST THERAPY WHOLE BODY SCAN
TECHNIQUE: The patient received 160 mCi 5-DUD sodium iodide for the treatment
of thyroid cancer within the past 10 days. The patient returns
today, and whole body scanning was performed in the anterior and
posterior projections.

[Series 1: i131 whole body · 2.66mm/px · 1 of 1 slices shown (1 of 2)]
[im 1/1  full-range]
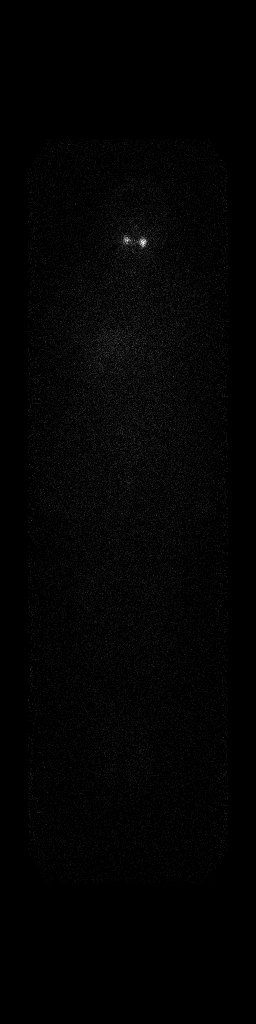

[Series 1: i131 whole body · 2.66mm/px · 1 of 1 slices shown (2 of 2)]
[im 1/1  full-range]
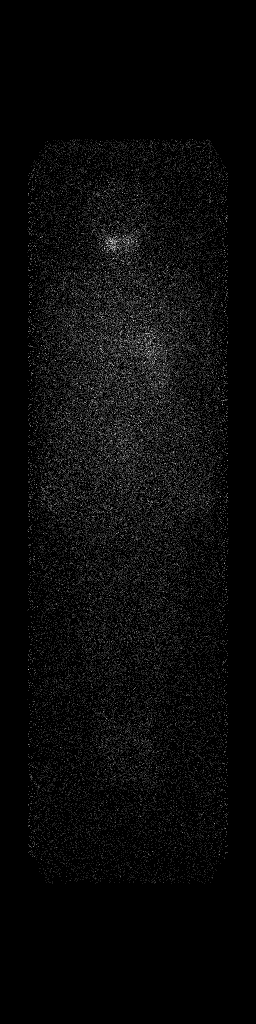

[Series 2: static with marker · 4.14mm/px · 1 of 1 slices shown]
[im 1/1]
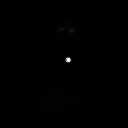

[Series 3: static without marker · non-contrast · 4.14mm/px · 1 of 1 slices shown]
[im 1/1]
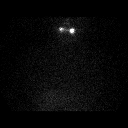

[4 of 4 positions shown; findings below may reference images not displayed]

FINDINGS: There is physiologic uptake on today's study including in the
submandibular glands. No distant metastatic disease identified. No
residual thyroid seen in the neck.
IMPRESSION: No distant metastatic disease. No residual thyroid tissue seen in
the neck.
